# Patient Record
Sex: Male | Born: 1955 | Race: White | Hispanic: No | Smoking: Never smoker
Health system: Southern US, Community
[De-identification: ages and names within clinical notes are randomized; demographics above are authoritative.]

## PROBLEM LIST (undated history)

## (undated) HISTORY — PX: VASECTOMY: SHX75

---

## 2012-02-15 ENCOUNTER — Other Ambulatory Visit: Payer: Self-pay | Admitting: Emergency Medicine

## 2012-02-15 ENCOUNTER — Ambulatory Visit (INDEPENDENT_AMBULATORY_CARE_PROVIDER_SITE_OTHER): Payer: BC Managed Care – PPO | Admitting: Emergency Medicine

## 2012-02-15 VITALS — BP 118/74 | HR 66 | Temp 97.9°F | Resp 16 | Ht 69.0 in | Wt 170.0 lb

## 2012-02-15 DIAGNOSIS — Z23 Encounter for immunization: Secondary | ICD-10-CM

## 2012-02-15 DIAGNOSIS — K625 Hemorrhage of anus and rectum: Secondary | ICD-10-CM

## 2012-02-15 DIAGNOSIS — Z0000 Encounter for general adult medical examination without abnormal findings: Secondary | ICD-10-CM

## 2012-02-15 LAB — POCT CBC
Granulocyte percent: 59.9 %G (ref 37–80)
MCH, POC: 28.8 pg (ref 27–31.2)
MCHC: 32.3 g/dL (ref 31.8–35.4)
MPV: 9.1 fL (ref 0–99.8)
Platelet Count, POC: 300 10*3/uL (ref 142–424)
RBC: 5.17 M/uL (ref 4.69–6.13)
RDW, POC: 13.7 %

## 2012-02-15 LAB — IFOBT (OCCULT BLOOD): IFOBT: NEGATIVE

## 2012-02-15 LAB — COMPREHENSIVE METABOLIC PANEL
AST: 20 U/L (ref 0–37)
Alkaline Phosphatase: 80 U/L (ref 39–117)
BUN: 12 mg/dL (ref 6–23)
Glucose, Bld: 99 mg/dL (ref 70–99)
Sodium: 139 mEq/L (ref 135–145)
Total Bilirubin: 1.7 mg/dL — ABNORMAL HIGH (ref 0.3–1.2)

## 2012-02-15 LAB — LIPID PANEL
Cholesterol: 179 mg/dL (ref 0–200)
LDL Cholesterol: 101 mg/dL — ABNORMAL HIGH (ref 0–99)
Total CHOL/HDL Ratio: 4.1 Ratio
Triglycerides: 168 mg/dL — ABNORMAL HIGH (ref <150)
VLDL: 34 mg/dL (ref 0–40)

## 2012-02-15 LAB — POCT URINALYSIS DIPSTICK
Glucose, UA: NEGATIVE
Nitrite, UA: NEGATIVE
Protein, UA: NEGATIVE
Spec Grav, UA: <=1.005
Urobilinogen, UA: 0.2

## 2012-02-15 LAB — POCT UA - MICROSCOPIC ONLY
Bacteria, U Microscopic: NEGATIVE
Casts, Ur, LPF, POC: NEGATIVE
Crystals, Ur, HPF, POC: NEGATIVE
Yeast, UA: NEGATIVE

## 2012-02-15 NOTE — Patient Instructions (Signed)
We'll go ahead and try Cardizem gel and lidocaine gel to see if that will help with the rectal bleeding .

## 2012-02-15 NOTE — Progress Notes (Signed)
@[facility] @  Patient ID: Jeffery Clements MRN: [id number], DOB: [birth year] 56 y.o. Date of Encounter: 02/15/2012, 9:29 AM  Primary Physician: Shade Flood, MD  Chief Complaint: Physical (CPE)  HPI: 56 y.o. y/o male with history noted below here for CPE.  Doing well. No issues/complaints.  Review of Systems:  Consitutional: No fever, chills, fatigue, night sweats, lymphadenopathy, or weight changes. Eyes: No visual changes, eye redness, or discharge. ENT/Mouth: Ears: No otalgia, tinnitus, hearing loss, discharge. Nose: No congestion, rhinorrhea, sinus pain, or epistaxis. Throat: No sore throat, post nasal drip, or teeth pain. Cardiovascular: No CP, palpitations, diaphoresis, DOE, edema, orthopnea, PND. Respiratory: No cough, hemoptysis, SOB, or wheezing. Gastrointestinal: No anorexia, dysphagia, reflux, pain, nausea, vomiting, hematemesis, diarrhea, constipation, he has noted some bright red blood per rectum that he feels is secondary to an anal tear from a hard bowel movement. , or melena. Genitourinary: No dysuria, frequency, urgency, hematuria, incontinence, nocturia, decreased urinary stream, discharge, impotence, or testicular pain/masses. Musculoskeletal: No decreased ROM, myalgias, stiffness, joint swelling, or weakness. Skin: No rash, erythema, lesion changes, pain, warmth, jaundice, or pruritis. Neurological: No headache, dizziness, syncope, seizures, tremors, memory loss, coordination problems, or paresthesias. Psychological: No anxiety, depression, hallucinations, SI/HI. Endocrine: No fatigue, polydipsia, polyphagia, polyuria, or known diabetes. All other systems were reviewed and are otherwise negative.  No past medical history on file. status post vasectomy   No past surgical history on file.  Home Meds:  Prior to Admission medications   Medication Sig Start Date End Date Taking? Authorizing Provider  cetaphil (CETAPHIL) cream Apply 1 g topically as needed.   Yes  Historical Provider, MD  Multiple Vitamin (MULTIVITAMIN) tablet Take 1 tablet by mouth daily.   Yes Historical Provider, MD    Allergies: No Known Allergies  History   Social History  . Marital Status: Divorced    Spouse Name: N/A    Number of Children: N/A  . Years of Education: N/A   Occupational History  . Not on file.   Social History Main Topics  . Smoking status: Never Smoker   . Smokeless tobacco: Not on file  . Alcohol Use: Not on file  . Drug Use: Not on file  . Sexually Active: Not on file   Other Topics Concern  . Not on file   Social History Narrative  . No narrative on file    No family history on file.  Physical Exam:  Blood pressure 118/74, pulse 66, temperature 97.9 F (36.6 C), temperature source Oral, resp. rate 16, height 5\' 9"  (1.753 m), weight 170 lb (77.111 kg), SpO2 97.00%.  General: Well developed, well nourished, in no acute distress. HEENT: Normocephalic, atraumatic. Conjunctiva pink, sclera non-icteric. Pupils 2 mm constricting to 1 mm, round, regular, and equally reactive to light and accomodation. EOMI. Internal auditory canal clear. TMs with good cone of light and without pathology. Nasal mucosa pink. Nares are without discharge. No sinus tenderness. Oral mucosa pink. Dentition is normal His. Pharynx without exudate.   Neck: Supple. Trachea midline. No thyromegaly. Full ROM. No lymphadenopathy. Lungs: Clear to auscultation bilaterally without wheezes, rales, or rhonchi. Breathing is of normal effort and unlabored. Cardiovascular: RRR with S1 S2. No murmurs, rubs, or gallops appreciated. Distal pulses 2+ symmetrically. No carotid or abdominal bruits.  Abdomen: Soft, non-tender, non-distended with normoactive bowel sounds. No hepatosplenomegaly or masses. No rebound/guarding. No CVA tenderness. Without hernias.  Rectal: . Rectal vault without masses. Rectal exam reveals a small tear present at 12:00. This area is not actively  bleeding  the  Genitourinary:  circumcised male. No penile lesions. Testes descended bilaterally, and smooth without tenderness or masses.  Musculoskeletal: Full range of motion and 5/5 strength throughout. Without swelling, atrophy, tenderness, crepitus, or warmth. Extremities without clubbing, cyanosis, or edema. Calves supple. Skin: Warm and moist without erythema, ecchymosis, wounds, or rash. Neuro: A+Ox3. CN II-XII grossly intact. Moves all extremities spontaneously. Full sensation throughout. Normal gait. DTR 2+ throughout upper and lower extremities. Finger to nose intact. Psych:  Responds to questions appropriately with a normal affect.   Studies:   CMET, Lipid, PSA, TSH,   all pending. Patient is  UA:    Assessment/Plan:  56 y.o. y/o  male here for CPE  -  Signed, Earl Lites, MD 02/15/2012 9:29 AM

## 2012-02-16 LAB — T4, FREE: Free T4: 1.09 ng/dL (ref 0.80–1.80)

## 2012-09-11 ENCOUNTER — Ambulatory Visit (INDEPENDENT_AMBULATORY_CARE_PROVIDER_SITE_OTHER): Payer: BC Managed Care – PPO | Admitting: Internal Medicine

## 2012-09-11 ENCOUNTER — Ambulatory Visit: Payer: BC Managed Care – PPO | Admitting: Emergency Medicine

## 2012-09-11 ENCOUNTER — Ambulatory Visit: Payer: BC Managed Care – PPO

## 2012-09-11 VITALS — BP 120/70 | HR 76 | Temp 98.0°F | Resp 16 | Ht 69.0 in | Wt 176.4 lb

## 2012-09-11 DIAGNOSIS — E781 Pure hyperglyceridemia: Secondary | ICD-10-CM

## 2012-09-11 DIAGNOSIS — E039 Hypothyroidism, unspecified: Secondary | ICD-10-CM

## 2012-09-11 DIAGNOSIS — M25539 Pain in unspecified wrist: Secondary | ICD-10-CM

## 2012-09-11 DIAGNOSIS — K602 Anal fissure, unspecified: Secondary | ICD-10-CM | POA: Insufficient documentation

## 2012-09-11 DIAGNOSIS — K635 Polyp of colon: Secondary | ICD-10-CM | POA: Insufficient documentation

## 2012-09-11 LAB — T4, FREE: Free T4: 0.97 ng/dL (ref 0.80–1.80)

## 2012-09-11 NOTE — Progress Notes (Signed)
  Subjective:    Patient ID: Jeffery Clements, male    DOB: April 13, 1956, 57 y.o.   MRN: [id number]  HPI patient enters for recheck of his elevated TSH. He overall is doing well he has had his colonoscopy which revealed polyps. He's had no further bleeding from his rectal fissure. He is in today primarily to have his triglycerides repeated as well as his TSH and T4. His bilirubin was also elevated on his last visit with normal LFTs. He is recently had some difficulty with his right wrist. He has pain when he supinates and pronates the right wrist. He's had no numbness in his hand.    Review of Systems     Objective:   Physical Exam HEENT exam is normal. Neck is supple. Chest is clear. Cardiac is regular rate without murmurs. Abdomen soft nontender to He has a negative Tinel's negative Finkelstein and negative Phalen's test. There is no atrophy of the thenar eminence. There is mild discomfort on forced into full supination pronation.  [facility] reading (PRIMARY) by  Dr.Daub normal .      Assessment & Plan:  I suspect a discomfort in his wrist is related to overuse. I have advised him to take some nonsteroidals as needed. We'll go ahead and repeat his lipids thyroid and fractionate his bilirubin

## 2012-09-12 LAB — BILIRUBIN, TOTAL: Total Bilirubin: 1.5 mg/dL — ABNORMAL HIGH (ref 0.3–1.2)

## 2012-09-12 LAB — BILIRUBIN,DIRECT & INDIRECT (FRACTIONATED): Indirect Bilirubin: 1.3 mg/dL — ABNORMAL HIGH (ref 0.0–0.9)

## 2012-09-12 LAB — LIPID PANEL
Cholesterol: 219 mg/dL — ABNORMAL HIGH (ref 0–200)
LDL Cholesterol: 134 mg/dL — ABNORMAL HIGH (ref 0–99)
VLDL: 37 mg/dL (ref 0–40)

## 2012-09-14 NOTE — Progress Notes (Signed)
  Subjective:    Patient ID: Jeffery Clements, male    DOB: 02-10-1956, 57 y.o.   MRN: [id number]  HPI    Review of Systems     Objective:   Physical Exam        Assessment & Plan:  Erroneous encounter

## 2013-02-12 ENCOUNTER — Telehealth: Payer: Self-pay

## 2013-02-12 ENCOUNTER — Ambulatory Visit (INDEPENDENT_AMBULATORY_CARE_PROVIDER_SITE_OTHER): Payer: BC Managed Care – PPO | Admitting: Emergency Medicine

## 2013-02-12 ENCOUNTER — Encounter: Payer: Self-pay | Admitting: Emergency Medicine

## 2013-02-12 VITALS — BP 104/74 | HR 65 | Temp 98.1°F | Resp 16 | Ht 68.5 in | Wt 171.6 lb

## 2013-02-12 DIAGNOSIS — R319 Hematuria, unspecified: Secondary | ICD-10-CM

## 2013-02-12 DIAGNOSIS — E039 Hypothyroidism, unspecified: Secondary | ICD-10-CM

## 2013-02-12 DIAGNOSIS — L259 Unspecified contact dermatitis, unspecified cause: Secondary | ICD-10-CM

## 2013-02-12 DIAGNOSIS — L309 Dermatitis, unspecified: Secondary | ICD-10-CM

## 2013-02-12 DIAGNOSIS — Z0000 Encounter for general adult medical examination without abnormal findings: Secondary | ICD-10-CM

## 2013-02-12 DIAGNOSIS — L989 Disorder of the skin and subcutaneous tissue, unspecified: Secondary | ICD-10-CM

## 2013-02-12 LAB — COMPREHENSIVE METABOLIC PANEL
AST: 20 U/L (ref 0–37)
Alkaline Phosphatase: 83 U/L (ref 39–117)
BUN: 13 mg/dL (ref 6–23)
Creat: 0.94 mg/dL (ref 0.50–1.35)
Total Bilirubin: 1.6 mg/dL — ABNORMAL HIGH (ref 0.3–1.2)

## 2013-02-12 LAB — POCT UA - MICROSCOPIC ONLY: Yeast, UA: NEGATIVE

## 2013-02-12 LAB — CBC WITH DIFFERENTIAL/PLATELET
Basophils Absolute: 0.1 10*3/uL (ref 0.0–0.1)
Basophils Relative: 1 % (ref 0–1)
Eosinophils Relative: 3 % (ref 0–5)
HCT: 44.5 % (ref 39.0–52.0)
MCHC: 34.4 g/dL (ref 30.0–36.0)
MCV: 84.9 fL (ref 78.0–100.0)
Monocytes Absolute: 0.6 10*3/uL (ref 0.1–1.0)
Neutro Abs: 3.6 10*3/uL (ref 1.7–7.7)
Platelets: 284 10*3/uL (ref 150–400)
RDW: 13.5 % (ref 11.5–15.5)

## 2013-02-12 LAB — LIPID PANEL
HDL: 46 mg/dL (ref >39)
LDL Cholesterol: 104 mg/dL — ABNORMAL HIGH (ref 0–99)
Total CHOL/HDL Ratio: 4.1 Ratio
VLDL: 40 mg/dL (ref 0–40)

## 2013-02-12 LAB — POCT URINALYSIS DIPSTICK
Ketones, UA: NEGATIVE
Leukocytes, UA: NEGATIVE
Protein, UA: NEGATIVE
Urobilinogen, UA: 0.2
pH, UA: 5.5

## 2013-02-12 LAB — PSA: PSA: 0.76 ng/mL (ref <=4.00)

## 2013-02-12 LAB — TSH: TSH: 6.098 u[IU]/mL — ABNORMAL HIGH (ref 0.350–4.500)

## 2013-02-12 MED ORDER — TRIAMCINOLONE 0.1 % CREAM:EUCERIN CREAM 1:1: 1.0000 "application " | TOPICAL_CREAM | Freq: Three times a day (TID) | CUTANEOUS | Status: DC

## 2013-02-12 NOTE — Telephone Encounter (Signed)
Go ahead and cancel the appointment for the CT scan. See if we can get him in for an appointment with a large urology this week to evaluate him for hematuria.

## 2013-02-12 NOTE — Telephone Encounter (Signed)
Wants to talk to Dr. Cleta Alberts about the referrals he recommended during his exam this morning. This would be very expensive as it will go to his deductible and would like to know if there are any alternatives before going this route, or if Dr. Cleta Alberts thinks it is necessary.  [phone]

## 2013-02-12 NOTE — Progress Notes (Signed)
@[facility] @  Patient ID: Jeffery Clements MRN: [id number], DOB: 12/08/1955 57 y.o. Date of Encounter: 02/12/2013, 8:09 AM  Primary Physician: Shade Flood, MD  Chief Complaint: Physical (CPE)  HPI: 57 y.o. y/o male with history noted below here for CPE.  Doing well. No issues/complaints.  Review of Systems: Consitutional: No fever, chills, fatigue, night sweats, lymphadenopathy, or weight changes. He has a history of borderline elevation of his TSH Eyes: No visual changes, eye redness, or discharge. ENT/Mouth: Ears: No otalgia, tinnitus, hearing loss, discharge. Nose: No congestion, rhinorrhea, sinus pain, or epistaxis. Throat: No sore throat, post nasal drip, or teeth pain. Cardiovascular: No CP, palpitations, diaphoresis, DOE, edema, orthopnea, PND. Respiratory: No cough, hemoptysis, SOB, or wheezing. Gastrointestinal: No anorexia, dysphagia, reflux, pain, nausea, vomiting, hematemesis, diarrhea, constipation, BRBPR, or melena. He has used her rectal fissure but this is not currently bothering him Genitourinary: No dysuria, frequency, urgency, hematuria, incontinence, nocturia, decreased urinary stream, discharge, impotence, or testicular pain/masses. Musculoskeletal: No decreased ROM, myalgias, stiffness, joint swelling, or weakness. Skin: No rash, erythema, lesion changes, pain, warmth, jaundice, or pruritis. He has some dry skin issues he would like a refill on his cream for that problem Neurological: No headache, dizziness, syncope, seizures, tremors, memory loss, coordination problems, or paresthesias. Psychological: No anxiety, depression, hallucinations, SI/HI. Endocrine: No fatigue, polydipsia, polyphagia, polyuria, or known diabetes. All other systems were reviewed and are otherwise negative.  No past medical history on file.   No past surgical history on file.  Home Meds:  Prior to Admission medications   Medication Sig Start Date End Date Taking? Authorizing Provider   cetaphil (CETAPHIL) cream Apply 1 g topically as needed.   Yes Historical Provider, MD  Multiple Vitamin (MULTIVITAMIN) tablet Take 1 tablet by mouth daily.   Yes Historical Provider, MD    Allergies: No Known Allergies  History   Social History  . Marital Status: Divorced    Spouse Name: N/A    Number of Children: N/A  . Years of Education: N/A   Occupational History  . Not on file.   Social History Main Topics  . Smoking status: Never Smoker   . Smokeless tobacco: Not on file  . Alcohol Use: No  . Drug Use: No  . Sexually Active: Not on file   Other Topics Concern  . Not on file   Social History Narrative  . No narrative on file    Family History  Problem Relation Age of Onset  . Lung disease Mother     Physical Exam:  Blood pressure 104/74, pulse 65, temperature 98.1 F (36.7 C), temperature source Oral, resp. rate 16, height 5' 8.5" (1.74 m), weight 171 lb 9.6 oz (77.837 kg), SpO2 96.00%.  General: Well developed, well nourished, in no acute distress. HEENT: Normocephalic, atraumatic. Conjunctiva pink, sclera non-icteric. Pupils 2 mm constricting to 1 mm, round, regular, and equally reactive to light and accomodation. EOMI. Internal auditory canal clear. TMs with good cone of light and without pathology. Nasal mucosa pink. Nares are without discharge. No sinus tenderness. Oral mucosa pink. Dentition . Pharynx without exudate.   Neck: Supple. Trachea midline. No thyromegaly. Full ROM. No lymphadenopathy. Lungs: Clear to auscultation bilaterally without wheezes, rales, or rhonchi. Breathing is of normal effort and unlabored. Cardiovascular: RRR with S1 S2. No murmurs, rubs, or gallops appreciated. Distal pulses 2+ symmetrically. No carotid or abdominal bruits.  Abdomen: Soft, non-tender, non-distended with normoactive bowel sounds. No hepatosplenomegaly or masses. No rebound/guarding. No CVA tenderness. Without hernias.  Rectal: No external hemorrhoids or fissures.  Rectal vault without masses.   Genitourinary:   circumcised male. No penile lesions. Testes descended bilaterally, and smooth without tenderness or masses.  Musculoskeletal: Full range of motion and 5/5 strength throughout. Without swelling, atrophy, tenderness, crepitus, or warmth. Extremities without clubbing, cyanosis, or edema. Calves supple. Skin: Warm and moist without erythema, ecchymosis, wounds, or rash. Neuro: A+Ox3. CN II-XII grossly intact. Moves all extremities spontaneously. Full sensation throughout. Normal gait. DTR 2+ throughout upper and lower extremities. Finger to nose intact. Psych:  Responds to questions appropriately with a normal affect.   Results for orders placed in visit on 09/11/12  T4, FREE      Result Value Range   Free T4 0.97  0.80 - 1.80 ng/dL  TSH      Result Value Range   TSH 5.214 (*) 0.350 - 4.500 uIU/mL  LIPID PANEL      Result Value Range   Cholesterol 219 (*) 0 - 200 mg/dL   Triglycerides 147 (*) <150 mg/dL   HDL 48  >82 mg/dL   Total CHOL/HDL Ratio 4.6     VLDL 37  0 - 40 mg/dL   LDL Cholesterol 956 (*) 0 - 99 mg/dL  BILIRUBIN,DIRECT & INDIRECT (FRACTIONATED)      Result Value Range   Bilirubin, Direct 0.2  0.0 - 0.3 mg/dL   Indirect Bilirubin 1.3 (*) 0.0 - 0.9 mg/dL  BILIRUBIN, TOTAL      Result Value Range   Total Bilirubin 1.5 (*) 0.3 - 1.2 mg/dL   Studies: CBC, CMET, Lipid, PSA, TSH, also check urine culture.      Assessment/Plan:  57 y.o. y/o male here for a complete physical. He has a history of borderline high TSH no other medical problems he does not smoke or drink and exercises regularly. His urinalysis was unusual in that it showed hyphae and some blood.  -  Signed, Earl Lites, MD 02/12/2013 8:09 AM

## 2013-02-12 NOTE — Progress Notes (Signed)
  Subjective:    Patient ID: Jeffery Clements, male    DOB: March 17, 1956, 57 y.o.   MRN: [id number]  HPI    Review of Systems     Objective:   Physical Exam        Assessment & Plan:

## 2013-02-13 LAB — URINE CULTURE: Organism ID, Bacteria: NO GROWTH

## 2013-02-13 NOTE — Telephone Encounter (Signed)
Urology referral made called patient to advise. CT cancelled also.

## 2013-02-15 ENCOUNTER — Ambulatory Visit (INDEPENDENT_AMBULATORY_CARE_PROVIDER_SITE_OTHER): Payer: BC Managed Care – PPO | Admitting: Physician Assistant

## 2013-02-15 VITALS — BP 120/80 | HR 70 | Temp 98.5°F | Resp 16 | Ht 68.0 in | Wt 171.0 lb

## 2013-02-15 DIAGNOSIS — R300 Dysuria: Secondary | ICD-10-CM

## 2013-02-15 LAB — POCT URINALYSIS DIPSTICK
Bilirubin, UA: NEGATIVE
Glucose, UA: NEGATIVE
Ketones, UA: NEGATIVE
Leukocytes, UA: NEGATIVE
Nitrite, UA: NEGATIVE
Protein, UA: NEGATIVE
Spec Grav, UA: 1.02
Urobilinogen, UA: 0.2
pH, UA: 5.5

## 2013-02-15 LAB — POCT UA - MICROSCOPIC ONLY
Casts, Ur, LPF, POC: NEGATIVE
Crystals, Ur, HPF, POC: NEGATIVE
Mucus, UA: NEGATIVE
Yeast, UA: NEGATIVE

## 2013-02-15 MED ORDER — FLUCONAZOLE 150 MG PO TABS: 150.0000 mg | ORAL_TABLET | Freq: Once | ORAL | Status: DC

## 2013-02-15 NOTE — Progress Notes (Signed)
   [address] [place] [address]   Phone [phone]  Subjective:    Patient ID: Jeffery Clements, male    DOB: 1956/03/13, 57 y.o.   MRN: [id number]  HPI  Pt presents to clinic with penile irritation.  He was seen on Tues for his CPE and during that visit he was diagnosed with hematuria and a referral to urology was done.  He was not having symptoms at the time but then yesterday he started to notice that he had burning at the tip of his penis all the time.  Last pm he had some discomfort in his L testicular but it was not related to urination.  He is not sexually active.  He is not having penile discharge.  Review of Systems  Genitourinary: Positive for penile pain (irritation at the tip of the urethra) and testicular pain (mild on the L). Negative for dysuria, frequency, flank pain, discharge, penile swelling, scrotal swelling and difficulty urinating.       Objective:   Physical Exam  Vitals reviewed. Constitutional: He appears well-developed and well-nourished.  HENT:  Head: Normocephalic and atraumatic.  Right Ear: External ear normal.  Left Ear: External ear normal.  Eyes: Conjunctivae are normal.  Pulmonary/Chest: Effort normal.  Genitourinary: Testes normal and penis normal. No penile erythema or penile tenderness. No discharge found.  Epididymis on the L is large but not tender.  Skin: Skin is warm and dry.  Psychiatric: He has a normal mood and affect. His behavior is normal. Judgment and thought content normal.       Assessment & Plan:  Dysuria - Due to yeast in his urine on Tuesday and urethral irritation will treat for yeast.  I wonder if this is partially psychological due to worry regarding hematuria because there is not irritation on exam - it is possible that he has a really early epididymitis but because there is not TTP on exam will wait on abx but if the pain worsens and starts to be with urination more he will call for an abx prior to his appt otherwise he  will go to his urology appt on Monday. - Plan: POCT urinalysis dipstick, POCT UA - Microscopic Only, fluconazole (DIFLUCAN) 150 MG tablet  Continue to go to his urology appt on Monday for hematuria.  Benny Lennert PA-C 02/15/2013 10:12 AM

## 2013-02-18 ENCOUNTER — Other Ambulatory Visit: Payer: Self-pay

## 2014-04-07 ENCOUNTER — Ambulatory Visit (INDEPENDENT_AMBULATORY_CARE_PROVIDER_SITE_OTHER): Payer: BC Managed Care – PPO | Admitting: Emergency Medicine

## 2014-04-07 ENCOUNTER — Ambulatory Visit (INDEPENDENT_AMBULATORY_CARE_PROVIDER_SITE_OTHER): Payer: BC Managed Care – PPO

## 2014-04-07 ENCOUNTER — Encounter: Payer: Self-pay | Admitting: Emergency Medicine

## 2014-04-07 VITALS — BP 124/72 | HR 65 | Temp 98.3°F | Resp 16 | Ht 69.0 in | Wt 172.2 lb

## 2014-04-07 DIAGNOSIS — Z801 Family history of malignant neoplasm of trachea, bronchus and lung: Secondary | ICD-10-CM

## 2014-04-07 DIAGNOSIS — R319 Hematuria, unspecified: Secondary | ICD-10-CM

## 2014-04-07 DIAGNOSIS — Z1211 Encounter for screening for malignant neoplasm of colon: Secondary | ICD-10-CM

## 2014-04-07 DIAGNOSIS — L259 Unspecified contact dermatitis, unspecified cause: Secondary | ICD-10-CM

## 2014-04-07 DIAGNOSIS — R946 Abnormal results of thyroid function studies: Secondary | ICD-10-CM

## 2014-04-07 DIAGNOSIS — R7989 Other specified abnormal findings of blood chemistry: Secondary | ICD-10-CM

## 2014-04-07 DIAGNOSIS — Z0000 Encounter for general adult medical examination without abnormal findings: Secondary | ICD-10-CM

## 2014-04-07 DIAGNOSIS — L309 Dermatitis, unspecified: Secondary | ICD-10-CM

## 2014-04-07 LAB — POCT URINALYSIS DIPSTICK
Bilirubin, UA: NEGATIVE
GLUCOSE UA: NEGATIVE
Ketones, UA: NEGATIVE
Leukocytes, UA: NEGATIVE
Nitrite, UA: NEGATIVE
PROTEIN UA: NEGATIVE
Urobilinogen, UA: 0.2
pH, UA: 5.5

## 2014-04-07 LAB — CBC WITH DIFFERENTIAL/PLATELET
Basophils Absolute: 0.1 10*3/uL (ref 0.0–0.1)
Basophils Relative: 1 % (ref 0–1)
Eosinophils Absolute: 0.2 10*3/uL (ref 0.0–0.7)
Eosinophils Relative: 3 % (ref 0–5)
HCT: 44.6 % (ref 39.0–52.0)
HEMOGLOBIN: 15.7 g/dL (ref 13.0–17.0)
LYMPHS PCT: 28 % (ref 12–46)
Lymphs Abs: 1.7 10*3/uL (ref 0.7–4.0)
MCH: 30 pg (ref 26.0–34.0)
MCHC: 35.2 g/dL (ref 30.0–36.0)
MCV: 85.3 fL (ref 78.0–100.0)
MONO ABS: 0.5 10*3/uL (ref 0.1–1.0)
MONOS PCT: 8 % (ref 3–12)
NEUTROS ABS: 3.7 10*3/uL (ref 1.7–7.7)
Neutrophils Relative %: 60 % (ref 43–77)
Platelets: 288 10*3/uL (ref 150–400)
RBC: 5.23 MIL/uL (ref 4.22–5.81)
RDW: 13.5 % (ref 11.5–15.5)
WBC: 6.2 10*3/uL (ref 4.0–10.5)

## 2014-04-07 LAB — TSH: TSH: 6.493 u[IU]/mL — AB (ref 0.350–4.500)

## 2014-04-07 LAB — COMPLETE METABOLIC PANEL WITH GFR
ALBUMIN: 4.8 g/dL (ref 3.5–5.2)
ALT: 32 U/L (ref 0–53)
AST: 22 U/L (ref 0–37)
Alkaline Phosphatase: 76 U/L (ref 39–117)
BUN: 16 mg/dL (ref 6–23)
CALCIUM: 9.7 mg/dL (ref 8.4–10.5)
CHLORIDE: 103 meq/L (ref 96–112)
CO2: 27 mEq/L (ref 19–32)
Creat: 1 mg/dL (ref 0.50–1.35)
GFR, EST NON AFRICAN AMERICAN: 82 mL/min
GFR, Est African American: >89 mL/min
GLUCOSE: 99 mg/dL (ref 70–99)
POTASSIUM: 4.6 meq/L (ref 3.5–5.3)
Sodium: 139 mEq/L (ref 135–145)
TOTAL PROTEIN: 7.7 g/dL (ref 6.0–8.3)
Total Bilirubin: 1.5 mg/dL — ABNORMAL HIGH (ref 0.2–1.2)

## 2014-04-07 LAB — IFOBT (OCCULT BLOOD): IMMUNOLOGICAL FECAL OCCULT BLOOD TEST: NEGATIVE

## 2014-04-07 LAB — LIPID PANEL
CHOLESTEROL: 192 mg/dL (ref 0–200)
HDL: 47 mg/dL (ref >39)
LDL Cholesterol: 114 mg/dL — ABNORMAL HIGH (ref 0–99)
Total CHOL/HDL Ratio: 4.1 Ratio
Triglycerides: 157 mg/dL — ABNORMAL HIGH (ref <150)
VLDL: 31 mg/dL (ref 0–40)

## 2014-04-07 LAB — T4, FREE: Free T4: 0.93 ng/dL (ref 0.80–1.80)

## 2014-04-07 MED ORDER — ZOSTER VACCINE LIVE 19400 UNT/0.65ML ~~LOC~~ SOLR: 0.6500 mL | Freq: Once | SUBCUTANEOUS | Status: DC

## 2014-04-07 MED ORDER — CLOBETASOL PROPIONATE 0.05 % EX CREA: 1.0000 "application " | TOPICAL_CREAM | Freq: Two times a day (BID) | CUTANEOUS | Status: DC

## 2014-04-07 NOTE — Progress Notes (Signed)
@[facility] @  Patient ID: Jeffery Clements MRN: [id number], DOB: October 27, 1955 58 y.o. Date of Encounter: 04/07/2014, 10:07 AM  Primary Physician: Wendie Agreste, MD  Chief Complaint: Physical (CPE)  HPI: 58 y.o. y/o male with history noted below here for CPE.  Doing well. No issues/complaints.  Review of Systems Consitutional: No fever, chills, fatigue, night sweats, lymphadenopathy, or weight changes. Eyes: No visual changes, eye redness, or discharge. ENT/Mouth: Ears: No otalgia, tinnitus, hearing loss, discharge. Nose: No congestion, rhinorrhea, sinus pain, or epistaxis. Throat: No sore throat, post nasal drip, or teeth pain. Cardiovascular: No CP, palpitations, diaphoresis, DOE, edema, orthopnea, PND. Respiratory: No cough, hemoptysis, SOB, or wheezing. Gastrointestinal: No anorexia, dysphagia, reflux, pain, nausea, vomiting, hematemesis, diarrhea, constipation, BRBPR, or melena. Genitourinary: No dysuria, frequency, urgency, hematuria, incontinence, nocturia, decreased urinary stream, discharge, impotence, or testicular pain/masses. Musculoskeletal: No decreased ROM, myalgias, stiffness, joint swelling, or weakness. Skin: No rash, erythema, lesion changes, pain, warmth, jaundice, or pruritis. Neurological: No headache, dizziness, syncope, seizures, tremors, memory loss, coordination problems, or paresthesias. Psychological: No anxiety, depression, hallucinations, SI/HI. Endocrine: No fatigue, polydipsia, polyphagia, polyuria, or known diabetes. All other systems were reviewed and are otherwise negative.  No past medical history on file.   Past Surgical History  Procedure Laterality Date  . Vasectomy      Home Meds:  Prior to Admission medications   Medication Sig Start Date End Date Taking? Authorizing Provider  clobetasol cream (TEMOVATE) 0.27 % Apply 1 application topically 2 (two) times daily.   Yes Historical Provider, MD  Multiple Vitamin (MULTIVITAMIN) tablet Take 1  tablet by mouth daily.   Yes Historical Provider, MD  Multiple Vitamins-Minerals (CENTRUM SILVER ADULT 50+) TABS Take by mouth.   Yes Historical Provider, MD  Triamcinolone Acetonide (TRIAMCINOLONE 0.1 % CREAM : EUCERIN) CREA Apply 1 application topically 3 (three) times daily. 02/12/13  Yes Darlyne Russian, MD  cetaphil (CETAPHIL) cream Apply 1 g topically as needed.    Historical Provider, MD  fluconazole (DIFLUCAN) 150 MG tablet Take 1 tablet (150 mg total) by mouth once. 02/15/13   Mancel Bale, PA-C    Allergies: No Known Allergies  History   Social History  . Marital Status: Divorced    Spouse Name: N/A    Number of Children: N/A  . Years of Education: N/A   Occupational History  . Accountant    Social History Main Topics  . Smoking status: Never Smoker   . Smokeless tobacco: Not on file  . Alcohol Use: No  . Drug Use: No  . Sexual Activity: Not on file   Other Topics Concern  . Not on file   Social History Narrative   Divorced. Education: [employer]. Exercise: [employer] 3 times a week for 30 minutes.    Family History  Problem Relation Age of Onset  . Lung disease Mother     Physical Exam Blood pressure 124/72, pulse 65, temperature 98.3 F (36.8 C), temperature source Oral, resp. rate 16, height 5\' 9"  (1.753 m), weight 172 lb 3.2 oz (78.109 kg), SpO2 99.00%.  General: Well developed, well nourished, in no acute distress. HEENT: Normocephalic, atraumatic. Conjunctiva pink, sclera non-icteric. Pupils 2 mm constricting to 1 mm, round, regular, and equally reactive to light and accomodation. EOMI. Internal auditory canal clear. TMs with good cone of light and without pathology. Nasal mucosa pink. Nares are without discharge. No sinus tenderness. Oral mucosa pink. Dentitio. Pharynx without exudate.   Neck: Supple. Trachea midline. No thyromegaly. Full ROM. No lymphadenopathy. Lungs:  Clear to auscultation bilaterally without wheezes, rales, or rhonchi. Breathing is of normal  effort and unlabored. Cardiovascular: RRR with S1 S2. No murmurs, rubs, or gallops appreciated. Distal pulses 2+ symmetrically. No carotid or abdominal bruits Abdomen: Soft, non-tender, non-distended with normoactive bowel sounds. No hepatosplenomegaly or masses. No rebound/guarding. No CVA tenderness. Without hernias.  Rectal: No external hemorrhoids or fissures. Rectal vault without masses  Genitourinary:  circumcised male. No penile lesions. Testes descended bilaterally, and smooth without tenderness or masses.  Musculoskeletal: Full range of motion and 5/5 strength throughout. Without swelling, atrophy, tenderness, crepitus, or warmth. Extremities without clubbing, cyanosis, or edema. Calves supple. Skin: Warm and moist without erythema, ecchymosis, wounds, or rash. Neuro: A+Ox3. CN II-XII grossly intact. Moves all extremities spontaneously. Full sensation throughout. Normal gait. DTR 2+ throughout upper and lower extremities. Finger to nose intact. Psych:  Responds to questions appropriately with a normal affect.  [facility] reading (PRIMARY) by  DrDaub no acute disease  Assessment/Plan:  58 y.o. y/o  male here for CPE. His steroids cream was refilled he uses for eczema. His physical exam is completely normal . TSH was slightly elevated last visit will recheck today. I did a chest x-ray today because of a family history of pulmonary disease. -  Signed, Nena Jordan, MD 04/07/2014 10:07 AM

## 2014-04-09 ENCOUNTER — Other Ambulatory Visit: Payer: Self-pay | Admitting: *Deleted

## 2014-04-09 DIAGNOSIS — E039 Hypothyroidism, unspecified: Secondary | ICD-10-CM

## 2014-04-09 MED ORDER — LEVOTHYROXINE SODIUM 75 MCG PO TABS: 75.0000 ug | ORAL_TABLET | Freq: Every day | ORAL | Status: DC

## 2014-07-10 ENCOUNTER — Ambulatory Visit (INDEPENDENT_AMBULATORY_CARE_PROVIDER_SITE_OTHER): Payer: BC Managed Care – PPO

## 2014-07-10 ENCOUNTER — Encounter: Payer: Self-pay | Admitting: Emergency Medicine

## 2014-07-10 ENCOUNTER — Ambulatory Visit (INDEPENDENT_AMBULATORY_CARE_PROVIDER_SITE_OTHER): Payer: BC Managed Care – PPO | Admitting: Emergency Medicine

## 2014-07-10 VITALS — BP 124/80 | HR 63 | Temp 98.3°F | Resp 16 | Ht 68.5 in | Wt 171.8 lb

## 2014-07-10 DIAGNOSIS — L309 Dermatitis, unspecified: Secondary | ICD-10-CM

## 2014-07-10 DIAGNOSIS — M25521 Pain in right elbow: Secondary | ICD-10-CM

## 2014-07-10 DIAGNOSIS — E039 Hypothyroidism, unspecified: Secondary | ICD-10-CM

## 2014-07-10 LAB — TSH: TSH: 0.606 u[IU]/mL (ref 0.350–4.500)

## 2014-07-10 LAB — T4, FREE: Free T4: 1.34 ng/dL (ref 0.80–1.80)

## 2014-07-10 MED ORDER — TRIAMCINOLONE 0.1 % CREAM:EUCERIN CREAM 1:1: 1.0000 "application " | TOPICAL_CREAM | Freq: Three times a day (TID) | CUTANEOUS | Status: DC

## 2014-07-10 NOTE — Patient Instructions (Addendum)
Lateral Epicondylitis (Tennis Elbow) with Rehab Lateral epicondylitis involves inflammation and pain around the outer portion of the elbow. The pain is caused by inflammation of the tendons in the forearm that bring back (extend) the wrist. Lateral epicondylitis is also called tennis elbow, because it is very common in tennis players. However, it may occur in any individual who extends the wrist repetitively. If lateral epicondylitis is left untreated, it may become a chronic problem. SYMPTOMS   Pain, tenderness, and inflammation on the outer (lateral) side of the elbow.  Pain or weakness with gripping activities.  Pain that increases with wrist-twisting motions (playing tennis, using a screwdriver, opening a door or a jar).  Pain with lifting objects, including a coffee cup. CAUSES  Lateral epicondylitis is caused by inflammation of the tendons that extend the wrist. Causes of injury may include:  Repetitive stress and strain on the muscles and tendons that extend the wrist.  Sudden change in activity level or intensity.  Incorrect grip in racquet sports.  Incorrect grip size of racquet (often too large).  Incorrect hitting position or technique (usually backhand, leading with the elbow).  Using a racket that is too heavy. RISK INCREASES WITH:  Sports or occupations that require repetitive and/or strenuous forearm and wrist movements (tennis, squash, racquetball, carpentry).  Poor wrist and forearm strength and flexibility.  Failure to warm up properly before activity.  Resuming activity before healing, rehabilitation, and conditioning are complete. PREVENTION   Warm up and stretch properly before activity.  Maintain physical fitness:  Strength, flexibility, and endurance.  Cardiovascular fitness.  Wear and use properly fitted equipment.  Learn and use proper technique and have a coach correct improper technique.  Wear a tennis elbow (counterforce) brace. PROGNOSIS    The course of this condition depends on the degree of the injury. If treated properly, acute cases (symptoms lasting less than 4 weeks) are often resolved in 2 to 6 weeks. Chronic (longer lasting cases) often resolve in 3 to 6 months but may require physical therapy. RELATED COMPLICATIONS   Frequently recurring symptoms, resulting in a chronic problem. Properly treating the problem the first time decreases frequency of recurrence.  Chronic inflammation, scarring tendon degeneration, and partial tendon tear, requiring surgery.  Delayed healing or resolution of symptoms. TREATMENT  Treatment first involves the use of ice and medicine to reduce pain and inflammation. Strengthening and stretching exercises may help reduce discomfort if performed regularly. These exercises may be performed at home if the condition is an acute injury. Chronic cases may require a referral to a physical therapist for evaluation and treatment. Your caregiver may advise a corticosteroid injection to help reduce inflammation. Rarely, surgery is needed. MEDICATION  If pain medicine is needed, nonsteroidal anti-inflammatory medicines (aspirin and ibuprofen), or other minor pain relievers (acetaminophen), are often advised.  Do not take pain medicine for 7 days before surgery.  Prescription pain relievers may be given, if your caregiver thinks they are needed. Use only as directed and only as much as you need.  Corticosteroid injections may be recommended. These injections should be reserved only for the most severe cases, because they can only be given a certain number of times. HEAT AND COLD  Cold treatment (icing) should be applied for 10 to 15 minutes every 2 to 3 hours for inflammation and pain, and immediately after activity that aggravates your symptoms. Use ice packs or an ice massage.  Heat treatment may be used before performing stretching and strengthening activities prescribed by your   caregiver, physical  therapist, or athletic trainer. Use a heat pack or a warm water soak. SEEK MEDICAL CARE IF: Symptoms get worse or do not improve in 2 weeks, despite treatment. EXERCISES  RANGE OF MOTION (ROM) AND STRETCHING EXERCISES - Epicondylitis, Lateral (Tennis Elbow)

## 2014-07-10 NOTE — Progress Notes (Signed)
   Subjective:  This chart was scribed for Darlyne Russian, MD by Ladene Artist, ED Scribe. The patient was seen in room 23. Patient's care was started at 11:29 AM.   Patient ID: Jeffery Clements, male    DOB: May 04, 1956, 58 y.o.   MRN: [id number]  Chief Complaint  Patient presents with  . Follow-up  . Thyroid  . inflammation right elbow x 2-3 months   HPI HPI Comments: Jeffery Clements is a 58 y.o. male who presents to the [facility] for follow-up regarding thyroid. Pt states that he has not noticed a difference since being placed on Synthroid.   Elbow Pain Pt reports bilateral elbow pain, R worse than L, over the past 3 months. Pain is exacerbated with lifting a glass and using a computer mouse at work. Pt attributes pain to doing over-the-head exercises 3 months ago. He reports associated warmth within the joints that he describes as intermittent.    Urology Pt started seeing urologist, Dr. Janice Norrie at [place], in 2013 for hematuria that has resolved. Pt follows up annually. Next appointment 07/2014.   No past medical history on file. Current Outpatient Prescriptions on File Prior to Visit  Medication Sig Dispense Refill  . clobetasol cream (TEMOVATE) 0.93 % Apply 1 application topically 2 (two) times daily. 60 g 1  . levothyroxine (SYNTHROID) 75 MCG tablet Take 1 tablet (75 mcg total) by mouth daily before breakfast. 30 tablet 5  . Multiple Vitamins-Minerals (CENTRUM SILVER ADULT 50+) TABS Take by mouth.    . Triamcinolone Acetonide (TRIAMCINOLONE 0.1 % CREAM : EUCERIN) CREA Apply 1 application topically 3 (three) times daily. 1 each 0  . zoster vaccine live, PF, (ZOSTAVAX) 81829 UNT/0.65ML injection Inject 19,400 Units into the skin once. 1 each 0  . cetaphil (CETAPHIL) cream Apply 1 g topically as needed.    . fluconazole (DIFLUCAN) 150 MG tablet Take 1 tablet (150 mg total) by mouth once. 1 tablet 0  . Multiple Vitamin (MULTIVITAMIN) tablet Take 1 tablet by mouth  daily.     No current facility-administered medications on file prior to visit.   No Known Allergies  Review of Systems  Constitutional: Negative for fever and chills.  Eyes: Negative for visual disturbance.  Respiratory: Negative for chest tightness and shortness of breath.   Cardiovascular: Negative for chest pain.  Genitourinary: Negative for hematuria.  Musculoskeletal: Positive for arthralgias.  Neurological: Negative for dizziness and light-headedness.      Objective:   Physical Exam CONSTITUTIONAL: Well developed/well nourished HEAD: Normocephalic/atraumatic EYES: EOMI/PERRL ENMT: Mucous membranes moist NECK: supple no meningeal signs SPINE/BACK:entire spine nontender CV: S1/S2 noted, no murmurs/rubs/gallops noted LUNGS: Lungs are clear to auscultation bilaterally, no apparent distress ABDOMEN: soft, nontender, no rebound or guarding, bowel sounds noted throughout abdomen GU:no cva tenderness NEURO: Pt is awake/alert/appropriate, moves all extremitiesx4. No facial droop. Motor strength and reflexes are normal.  EXTREMITIES: pulses normal/equal, full ROM. Tenderness over R lateral epicondyl. Pain with extension of wrist and index finger against resistance. SKIN: warm, color normal PSYCH: no abnormalities of mood noted, alert and oriented to situation   [facility] reading (PRIMARY) by  Dr. Everlene Farrier x-rays of the right elbow are normal.  Assessment & Plan:  Thyroid studies were done. He will get use ice and Aleve for his elbow discomfort. Recheck 6 months. I personally performed the services described in this documentation, which was scribed in my presence. The recorded information has been reviewed and is accurate.

## 2014-09-23 ENCOUNTER — Other Ambulatory Visit: Payer: Self-pay | Admitting: Emergency Medicine

## 2015-01-30 ENCOUNTER — Other Ambulatory Visit: Payer: Self-pay | Admitting: Emergency Medicine

## 2015-02-18 ENCOUNTER — Other Ambulatory Visit: Payer: Self-pay | Admitting: Physician Assistant

## 2015-03-26 ENCOUNTER — Other Ambulatory Visit: Payer: Self-pay | Admitting: Physician Assistant

## 2015-04-01 ENCOUNTER — Ambulatory Visit (INDEPENDENT_AMBULATORY_CARE_PROVIDER_SITE_OTHER): Payer: BLUE CROSS/BLUE SHIELD | Admitting: Family Medicine

## 2015-04-01 VITALS — BP 110/72 | HR 63 | Temp 97.9°F | Resp 18 | Ht 69.25 in | Wt 179.8 lb

## 2015-04-01 DIAGNOSIS — R319 Hematuria, unspecified: Secondary | ICD-10-CM | POA: Diagnosis not present

## 2015-04-01 DIAGNOSIS — R300 Dysuria: Secondary | ICD-10-CM

## 2015-04-01 DIAGNOSIS — N410 Acute prostatitis: Secondary | ICD-10-CM | POA: Diagnosis not present

## 2015-04-01 LAB — POCT URINALYSIS DIPSTICK
Bilirubin, UA: NEGATIVE
Glucose, UA: NEGATIVE
Ketones, UA: NEGATIVE
LEUKOCYTES UA: NEGATIVE
Nitrite, UA: NEGATIVE
PROTEIN UA: NEGATIVE
Spec Grav, UA: 1.02
UROBILINOGEN UA: 0.2
pH, UA: 6

## 2015-04-01 LAB — POCT CBC
GRANULOCYTE PERCENT: 61 % (ref 37–80)
HCT, POC: 46.2 % (ref 43.5–53.7)
Hemoglobin: 15.1 g/dL (ref 14.1–18.1)
Lymph, poc: 2.6 (ref 0.6–3.4)
MCH, POC: 27.9 pg (ref 27–31.2)
MCHC: 32.8 g/dL (ref 31.8–35.4)
MCV: 85.3 fL (ref 80–97)
MID (cbc): 0.6 (ref 0–0.9)
MPV: 7.2 fL (ref 0–99.8)
POC GRANULOCYTE: 5.1 (ref 2–6.9)
POC LYMPH PERCENT: 31.4 %L (ref 10–50)
POC MID %: 7.6 %M (ref 0–12)
Platelet Count, POC: 310 10*3/uL (ref 142–424)
RBC: 5.42 M/uL (ref 4.69–6.13)
RDW, POC: 13 %
WBC: 8.3 10*3/uL (ref 4.6–10.2)

## 2015-04-01 LAB — POCT UA - MICROSCOPIC ONLY
CASTS, UR, LPF, POC: NEGATIVE
CRYSTALS, UR, HPF, POC: NEGATIVE
Mucus, UA: NEGATIVE
WBC, Ur, HPF, POC: NEGATIVE
Yeast, UA: NEGATIVE

## 2015-04-01 MED ORDER — SULFAMETHOXAZOLE-TRIMETHOPRIM 800-160 MG PO TABS: 1.0000 | ORAL_TABLET | Freq: Two times a day (BID) | ORAL | Status: DC

## 2015-04-01 NOTE — Patient Instructions (Signed)
Sulfamethoxazole; Trimethoprim, SMX-TMP tablets What is this medicine? SULFAMETHOXAZOLE; TRIMETHOPRIM or SMX-TMP (suhl fuh meth OK suh zohl; trye METH oh prim) is a combination of a sulfonamide antibiotic and a second antibiotic, trimethoprim. It is used to treat or prevent certain kinds of bacterial infections. It will not work for colds, flu, or other viral infections. This medicine may be used for other purposes; ask your health care provider or pharmacist if you have questions. COMMON BRAND NAME(S): Bacter-Aid DS, Bactrim, Bactrim DS, Septra, Septra DS What should I tell my health care provider before I take this medicine? They need to know if you have any of these conditions: -anemia -asthma -being treated with anticonvulsants -if you frequently drink alcohol containing drinks -kidney disease -liver disease -low level of folic acid or glucose-6-phosphate dehydrogenase -poor nutrition or malabsorption -porphyria -severe allergies -thyroid disorder -an unusual or allergic reaction to sulfamethoxazole, trimethoprim, sulfa drugs, other medicines, foods, dyes, or preservatives -pregnant or trying to get pregnant -breast-feeding How should I use this medicine? Take this medicine by mouth with a full glass of water. Follow the directions on the prescription label. Take your medicine at regular intervals. Do not take it more often than directed. Do not skip doses or stop your medicine early. Talk to your pediatrician regarding the use of this medicine in children. Special care may be needed. This medicine has been used in children as young as 2 months of age. Overdosage: If you think you have taken too much of this medicine contact a poison control center or emergency room at once. NOTE: This medicine is only for you. Do not share this medicine with others. What if I miss a dose? If you miss a dose, take it as soon as you can. If it is almost time for your next dose, take only that dose. Do  not take double or extra doses. What may interact with this medicine? Do not take this medicine with any of the following medications: -aminobenzoate potassium -dofetilide -metronidazole This medicine may also interact with the following medications: -ACE inhibitors like benazepril, enalapril, lisinopril, and ramipril -birth control pills -cyclosporine -digoxin -diuretics -indomethacin -medicines for diabetes -methenamine -methotrexate -phenytoin -potassium supplements -pyrimethamine -sulfinpyrazone -tricyclic antidepressants -warfarin This list may not describe all possible interactions. Give your health care provider a list of all the medicines, herbs, non-prescription drugs, or dietary supplements you use. Also tell them if you smoke, drink alcohol, or use illegal drugs. Some items may interact with your medicine. What should I watch for while using this medicine? Tell your doctor or health care professional if your symptoms do not improve. Drink several glasses of water a day to reduce the risk of kidney problems. Do not treat diarrhea with over the counter products. Contact your doctor if you have diarrhea that lasts more than 2 days or if it is severe and watery. This medicine can make you more sensitive to the sun. Keep out of the sun. If you cannot avoid being in the sun, wear protective clothing and use a sunscreen. Do not use sun lamps or tanning beds/booths. What side effects may I notice from receiving this medicine? Side effects that you should report to your doctor or health care professional as soon as possible: -allergic reactions like skin rash or hives, swelling of the face, lips, or tongue -breathing problems -fever or chills, sore throat -irregular heartbeat, chest pain -joint or muscle pain -pain or difficulty passing urine -red pinpoint spots on skin -redness, blistering, peeling or loosening of   the skin, including inside the mouth -unusual bleeding or  bruising -unusually weak or tired -yellowing of the eyes or skin Side effects that usually do not require medical attention (report to your doctor or health care professional if they continue or are bothersome): -diarrhea -dizziness -headache -loss of appetite -nausea, vomiting -nervousness This list may not describe all possible side effects. Call your doctor for medical advice about side effects. You may report side effects to FDA at 1-800-FDA-1088. Where should I keep my medicine? Keep out of the reach of children. Store at room temperature between 20 to 25 degrees C (68 to 77 degrees F). Protect from light. Throw away any unused medicine after the expiration date. NOTE: This sheet is a summary. It may not cover all possible information. If you have questions about this medicine, talk to your doctor, pharmacist, or health care provider.  2015, Elsevier/Gold Standard. (2013-03-15 14:38:26) Prostatitis The prostate gland is about the size and shape of a walnut. It is located just below your bladder. It produces one of the components of semen, which is made up of sperm and the fluids that help nourish and transport it out from the testicles. Prostatitis is inflammation of the prostate gland.  There are four types of prostatitis:  Acute bacterial prostatitis. This is the least common type of prostatitis. It starts quickly and usually is associated with a bladder infection, high fever, and shaking chills. It can occur at any age.  Chronic bacterial prostatitis. This is a persistent bacterial infection in the prostate. It usually develops from repeated acute bacterial prostatitis or acute bacterial prostatitis that was not properly treated. It can occur in men of any age but is most common in middle-aged men whose prostate has begun to enlarge. The symptoms are not as severe as those in acute bacterial prostatitis. Discomfort in the part of your body that is in front of your rectum and below  your scrotum (perineum), lower abdomen, or in the head of your penis (glans) may represent your primary discomfort.  Chronic prostatitis (nonbacterial). This is the most common type of prostatitis. It is inflammation of the prostate gland that is not caused by a bacterial infection. The cause is unknown and may be associated with a viral infection or autoimmune disorder.  Prostatodynia (pelvic floor disorder). This is associated with increased muscular tone in the pelvis surrounding the prostate. CAUSES The causes of bacterial prostatitis are bacterial infection. The causes of the other types of prostatitis are unknown.  SYMPTOMS  Symptoms can vary depending upon the type of prostatitis that exists. There can also be overlap in symptoms. Possible symptoms for each type of prostatitis are listed below. Acute Bacterial Prostatitis  Painful urination.  Fever or chills.  Muscle or joint pains.  Low back pain.  Low abdominal pain.  Inability to empty bladder completely. Chronic Bacterial Prostatitis, Chronic Nonbacterial Prostatitis, and Prostatodynia  Sudden urge to urinate.  Frequent urination.  Difficulty starting urine stream.  Weak urine stream.  Discharge from the urethra.  Dribbling after urination.  Rectal pain.  Pain in the testicles, penis, or tip of the penis.  Pain in the perineum.  Problems with sexual function.  Painful ejaculation.  Bloody semen. DIAGNOSIS  In order to diagnose prostatitis, your health care provider will ask about your symptoms. One or more urine samples will be taken and tested (urinalysis). If the urinalysis result is negative for bacteria, your health care provider may use a finger to feel your prostate (digital rectal  exam). This exam helps your health care provider determine if your prostate is swollen and tender. It will also produce a specimen of semen that can be analyzed. TREATMENT  Treatment for prostatitis depends on the cause.  If a bacterial infection is the cause, it can be treated with antibiotic medicine. In cases of chronic bacterial prostatitis, the use of antibiotics for up to 1 month or 6 weeks may be necessary. Your health care provider may instruct you to take sitz baths to help relieve pain. A sitz bath is a bath of hot water in which your hips and buttocks are under water. This relaxes the pelvic floor muscles and often helps to relieve the pressure on your prostate. HOME CARE INSTRUCTIONS   Take all medicines as directed by your health care provider.  Take sitz baths as directed by your health care provider. SEEK MEDICAL CARE IF:   Your symptoms get worse, not better.  You have a fever. SEEK IMMEDIATE MEDICAL CARE IF:   You have chills.  You feel nauseous or vomit.  You feel lightheaded or faint.  You are unable to urinate.  You have blood or blood clots in your urine. MAKE SURE YOU:  Understand these instructions.  Will watch your condition.  Will get help right away if you are not doing well or get worse. Document Released: 08/05/2000 Document Revised: 08/13/2013 Document Reviewed: 02/25/2013 [facility] Patient Information 2015 [place], [place]. This information is not intended to replace advice given to you by your health care provider. Make sure you discuss any questions you have with your health care provider.

## 2015-04-01 NOTE — Progress Notes (Signed)
[facility] [address], [place] [address] [phone]  Date:  04/01/2015   Name:  Jeffery Clements   DOB:  11/30/1955   MRN:  [id number]  PCP:  Wendie Agreste, MD    Chief Complaint: Dysuria and Testicle Pain   History of Present Illness:  Jeffery Clements is a 59 y.o. very pleasant male patient who presents with the following:  Dysuria, warmth of both testicles: - Began several weeks ago, then worsened the last few days prompting her to come in.  - No systemic symptoms.  - Denies penile d/c.   - No hematuria or blood in semen.   - Minimal pain when not urinating. No increased frequency or urgency.  - Last seen by urology for hematuria in 07/2014 and cleared for further follow up at that time.  - No previous testicular issues.  - Had yeast  Infection several years ago.  - Not sexually active.  Denies ever having an STI.  - No history of kidney stones - no allergies   Patient Active Problem List   Diagnosis Date Noted  . Hypertriglyceridemia 09/11/2012  . Rectal fissure 09/11/2012  . Colon polyps 09/11/2012    History reviewed. No pertinent past medical history.  Past Surgical History  Procedure Laterality Date  . Vasectomy      Social History  Substance Use Topics  . Smoking status: Never Smoker   . Smokeless tobacco: None  . Alcohol Use: No    Family History  Problem Relation Age of Onset  . Lung disease Mother     No Known Allergies  Medication list has been reviewed and updated.  Current Outpatient Prescriptions on File Prior to Visit  Medication Sig Dispense Refill  . clobetasol cream (TEMOVATE) 3.82 % Apply 1 application topically 2 (two) times daily. 60 g 1  . levothyroxine (SYNTHROID, LEVOTHROID) 75 MCG tablet TAKE ONE TABLET BY MOUTH ONCE DAILY BEFORE  BREAKFAST 30 tablet 0  . Multiple Vitamin (MULTIVITAMIN) tablet Take 1 tablet by mouth daily.    . Multiple Vitamins-Minerals (CENTRUM SILVER ADULT 50+) TABS Take by  mouth.    . Triamcinolone Acetonide (TRIAMCINOLONE 0.1 % CREAM : EUCERIN) CREA Apply 1 application topically 3 (three) times daily. 1 each 3  . cetaphil (CETAPHIL) cream Apply 1 g topically as needed.    . fluconazole (DIFLUCAN) 150 MG tablet Take 1 tablet (150 mg total) by mouth once. (Patient not taking: Reported on 04/01/2015) 1 tablet 0  . zoster vaccine live, PF, (ZOSTAVAX) 50539 UNT/0.65ML injection Inject 19,400 Units into the skin once. (Patient not taking: Reported on 04/01/2015) 1 each 0   No current facility-administered medications on file prior to visit.    Review of Systems:  Review of Systems  Constitutional: Negative for fever, chills, weight loss and malaise/fatigue.  Respiratory: Negative for cough, sputum production, shortness of breath and wheezing.   Cardiovascular: Negative for chest pain.  Gastrointestinal: Negative for nausea, vomiting and constipation.  Genitourinary: Positive for dysuria. Negative for urgency, frequency and hematuria.  Musculoskeletal: Negative for myalgias.  Skin: Negative for rash.  Neurological: Negative for headaches.  Endo/Heme/Allergies: Does not bruise/bleed easily.    Physical Examination: Filed Vitals:   04/01/15 1837  BP: 110/72  Pulse: 63  Temp: 97.9 F (36.6 C)  Resp: 18   Filed Vitals:   04/01/15 1837  Height: 5' 9.25" (1.759 m)  Weight: 179 lb 12.8 oz (81.557 kg)   Body mass index is 26.36 kg/(m^2). Ideal  Body Weight: Weight in (lb) to have BMI = 25: 170.2  GEN: WDWN, NAD, Non-toxic, A & O x 3 HEENT: Atraumatic, Normocephalic. Neck supple. No masses, No LAD. Ears and Nose: No external deformity. CV: RRR, No M/G/R. No JVD. No thrill. No extra heart sounds. PULM: CTA B, no wheezes, crackles, rhonchi. No retractions. No resp. distress. No accessory muscle use. ABD: S, NT, ND, +BS. No rebound. No HSM. GU: no visible erythema or swelling. Non-tender testicles b/l with no lumps.   Rectal: Firm, mildly enlarged prostate  without nodularity.  Moderately TTP, which the patient did feel was consistent with his symptoms.  EXTR: No c/c/e. No inguinal LAD.  NEURO Normal gait.  PSYCH: Normally interactive. Conversant. Not depressed or anxious appearing.  Calm demeanor.   Assessment and Plan: Acute Prostatitis with hematuria.  - Bactrim 800-160mg  bid x 2 weeks. Consider prolonging course at f/u in 2 weeks.  - CBC negative today.  - Will check urine culture and PSA today - f/u immediately with any worsening or new  - f/u in 2 weeks for previously scheduled   Hematuria: following prostatitis treatment ensure that urine clears.  Patient does have a history of hematuria and previously was cleared by urology.   Signed Gerre Pebbles, MD

## 2015-04-03 LAB — URINE CULTURE
Colony Count: NO GROWTH
ORGANISM ID, BACTERIA: NO GROWTH

## 2015-04-03 LAB — PSA: PSA: 2.09 ng/mL (ref <=4.00)

## 2015-04-03 NOTE — Progress Notes (Signed)
The recorded information has been reviewed and considered. Agree with A/P. Dr Marin Comment

## 2015-04-07 ENCOUNTER — Telehealth: Payer: Self-pay | Admitting: Family Medicine

## 2015-04-07 NOTE — Telephone Encounter (Signed)
I called Jeffery Clements regarding his PSA and urine culture results. He did not answer. I left a brief voicemail stating it was not an emergency and I would call back at a later date.

## 2015-04-14 ENCOUNTER — Other Ambulatory Visit: Payer: Self-pay | Admitting: Emergency Medicine

## 2015-04-14 ENCOUNTER — Encounter: Payer: Self-pay | Admitting: Emergency Medicine

## 2015-04-14 ENCOUNTER — Ambulatory Visit (INDEPENDENT_AMBULATORY_CARE_PROVIDER_SITE_OTHER): Payer: BLUE CROSS/BLUE SHIELD | Admitting: Emergency Medicine

## 2015-04-14 ENCOUNTER — Encounter: Payer: Self-pay | Admitting: Family Medicine

## 2015-04-14 ENCOUNTER — Ambulatory Visit (INDEPENDENT_AMBULATORY_CARE_PROVIDER_SITE_OTHER): Payer: BLUE CROSS/BLUE SHIELD

## 2015-04-14 VITALS — BP 112/77 | HR 77 | Temp 98.5°F | Resp 16 | Ht 69.0 in | Wt 174.0 lb

## 2015-04-14 DIAGNOSIS — Z0000 Encounter for general adult medical examination without abnormal findings: Secondary | ICD-10-CM | POA: Diagnosis not present

## 2015-04-14 DIAGNOSIS — R319 Hematuria, unspecified: Secondary | ICD-10-CM | POA: Diagnosis not present

## 2015-04-14 DIAGNOSIS — N410 Acute prostatitis: Secondary | ICD-10-CM | POA: Diagnosis not present

## 2015-04-14 DIAGNOSIS — R946 Abnormal results of thyroid function studies: Secondary | ICD-10-CM | POA: Diagnosis not present

## 2015-04-14 DIAGNOSIS — R7989 Other specified abnormal findings of blood chemistry: Secondary | ICD-10-CM

## 2015-04-14 LAB — LIPID PANEL
CHOLESTEROL: 167 mg/dL (ref 125–200)
HDL: 44 mg/dL (ref >=40)
LDL CALC: 95 mg/dL (ref <130)
Total CHOL/HDL Ratio: 3.8 Ratio (ref <=5.0)
Triglycerides: 141 mg/dL (ref <150)
VLDL: 28 mg/dL (ref <30)

## 2015-04-14 LAB — POCT UA - MICROSCOPIC ONLY
CASTS, UR, LPF, POC: NEGATIVE
CRYSTALS, UR, HPF, POC: NEGATIVE
Epithelial cells, urine per micros: NEGATIVE
Mucus, UA: NEGATIVE
YEAST UA: NEGATIVE

## 2015-04-14 LAB — COMPLETE METABOLIC PANEL WITH GFR
ALT: 19 U/L (ref 9–46)
AST: 19 U/L (ref 10–35)
Albumin: 4.6 g/dL (ref 3.6–5.1)
Alkaline Phosphatase: 89 U/L (ref 40–115)
BILIRUBIN TOTAL: 1.4 mg/dL — AB (ref 0.2–1.2)
BUN: 14 mg/dL (ref 7–25)
CALCIUM: 9.5 mg/dL (ref 8.6–10.3)
CO2: 25 mmol/L (ref 20–31)
CREATININE: 1.12 mg/dL (ref 0.70–1.25)
Chloride: 101 mmol/L (ref 98–110)
GFR, EST AFRICAN AMERICAN: 82 mL/min (ref >=60)
GFR, Est Non African American: 71 mL/min (ref >=60)
Glucose, Bld: 86 mg/dL (ref 65–99)
Potassium: 4.1 mmol/L (ref 3.5–5.3)
Sodium: 136 mmol/L (ref 135–146)
TOTAL PROTEIN: 7.3 g/dL (ref 6.1–8.1)

## 2015-04-14 LAB — CBC WITH DIFFERENTIAL/PLATELET
Basophils Absolute: 0.1 10*3/uL (ref 0.0–0.1)
Basophils Relative: 1 % (ref 0–1)
EOS PCT: 2 % (ref 0–5)
Eosinophils Absolute: 0.1 10*3/uL (ref 0.0–0.7)
HEMATOCRIT: 45.1 % (ref 39.0–52.0)
HEMOGLOBIN: 15.3 g/dL (ref 13.0–17.0)
LYMPHS ABS: 2.4 10*3/uL (ref 0.7–4.0)
LYMPHS PCT: 33 % (ref 12–46)
MCH: 29.4 pg (ref 26.0–34.0)
MCHC: 33.9 g/dL (ref 30.0–36.0)
MCV: 86.7 fL (ref 78.0–100.0)
MPV: 10 fL (ref 8.6–12.4)
Monocytes Absolute: 0.7 10*3/uL (ref 0.1–1.0)
Monocytes Relative: 9 % (ref 3–12)
NEUTROS ABS: 4 10*3/uL (ref 1.7–7.7)
NEUTROS PCT: 55 % (ref 43–77)
Platelets: 309 10*3/uL (ref 150–400)
RBC: 5.2 MIL/uL (ref 4.22–5.81)
RDW: 13.3 % (ref 11.5–15.5)
WBC: 7.3 10*3/uL (ref 4.0–10.5)

## 2015-04-14 LAB — POCT URINALYSIS DIPSTICK
Bilirubin, UA: NEGATIVE
Glucose, UA: NEGATIVE
Ketones, UA: NEGATIVE
Leukocytes, UA: NEGATIVE
NITRITE UA: NEGATIVE
PH UA: 6
Protein, UA: NEGATIVE
SPEC GRAV UA: 1.01
UROBILINOGEN UA: 0.2

## 2015-04-14 LAB — TSH: TSH: 1.522 u[IU]/mL (ref 0.350–4.500)

## 2015-04-14 LAB — HEPATITIS C ANTIBODY: HCV AB: NEGATIVE

## 2015-04-14 LAB — HIV ANTIBODY (ROUTINE TESTING W REFLEX): HIV: NONREACTIVE

## 2015-04-14 NOTE — Progress Notes (Addendum)
This chart was scribed for Jeffery Queen, MD by Leandra Kern, Medical Scribe. This patient was seen in Room 24 and the patient's care was started at 8:26 AM.  Chief Complaint:  Chief Complaint  Patient presents with  . Annual Exam    HPI: Jeffery Clements is a 59 y.o. male who reports to [facility] today for a complete physical exam.  Pt reports that about 2 weeks ago he was presented with symptoms of dysuria, pain in the scrotum area. Pt was seen by Dr. Marin Comment here at the office on 08/10 and was diagnosed with acute prostatitis with hematuria. Pt's PSA and urine culture showed no acute findings. pt was prescribed Bactrim 800-160 mg for two weeks from which he has one more dose left. Pt notes that he did find some relief from the medication. Pt denies symptoms of urgency. He also denies family history of prostate issues, working in a chemically involved environment, or having a previous history of kidney stones.  Pt is not a smoker.    History reviewed. No pertinent past medical history. Past Surgical History  Procedure Laterality Date  . Vasectomy     Social History   Social History  . Marital Status: Divorced    Spouse Name: N/A  . Number of Children: N/A  . Years of Education: N/A   Occupational History  . Accountant    Social History Main Topics  . Smoking status: Never Smoker   . Smokeless tobacco: None  . Alcohol Use: No  . Drug Use: No  . Sexual Activity: Not Asked   Other Topics Concern  . None   Social History Narrative   Divorced. Education: [employer]. Exercise: Bouflex 3 times a week for 30 minutes.   Family History  Problem Relation Age of Onset  . Lung disease Mother    No Known Allergies Prior to Admission medications   Medication Sig Start Date End Date Taking? Authorizing Provider  cetaphil (CETAPHIL) cream Apply 1 g topically as needed.    Historical Provider, MD  clobetasol cream (TEMOVATE) 4.09 % Apply 1 application topically 2 (two) times daily.  04/07/14   Darlyne Russian, MD  fluconazole (DIFLUCAN) 150 MG tablet Take 1 tablet (150 mg total) by mouth once. Patient not taking: Reported on 04/01/2015 02/15/13   Mancel Bale, PA-C  levothyroxine (SYNTHROID, LEVOTHROID) 75 MCG tablet TAKE ONE TABLET BY MOUTH ONCE DAILY BEFORE  BREAKFAST 03/27/15   Harrison Mons, PA-C  Multiple Vitamin (MULTIVITAMIN) tablet Take 1 tablet by mouth daily.    Historical Provider, MD  Multiple Vitamins-Minerals (CENTRUM SILVER ADULT 50+) TABS Take by mouth.    Historical Provider, MD  sulfamethoxazole-trimethoprim (BACTRIM DS,SEPTRA DS) 800-160 MG per tablet Take 1 tablet by mouth 2 (two) times daily. 04/01/15   Gerre Pebbles, MD  Triamcinolone Acetonide (TRIAMCINOLONE 0.1 % CREAM : EUCERIN) CREA Apply 1 application topically 3 (three) times daily. 07/10/14   Darlyne Russian, MD  zoster vaccine live, PF, (ZOSTAVAX) 81191 UNT/0.65ML injection Inject 19,400 Units into the skin once. Patient not taking: Reported on 04/01/2015 04/07/14   Darlyne Russian, MD     ROS: The patient has dysuria, hematuria. Denies urinary urgency.  All other systems have been reviewed and were otherwise negative with the exception of those mentioned in the HPI and as above.    PHYSICAL EXAM: Filed Vitals:   04/14/15 0816  BP: 112/77  Pulse: 77  Temp: 98.5 F (36.9 C)  Resp: 16  Body mass index is 25.68 kg/(m^2).   General: Alert, no acute distress HEENT:  Normocephalic, atraumatic, oropharynx patent. Eye: Juliette Mangle [facility] Cardiovascular:  Regular rate and rhythm, no rubs murmurs or gallops.  No Carotid bruits, radial pulse intact. No pedal edema.  Respiratory: Clear to auscultation bilaterally.  No wheezes, rales, or rhonchi.  No cyanosis, no use of accessory musculature Abdominal: No organomegaly, abdomen is soft and non-tender, positive bowel sounds.  No masses. Musculoskeletal: Gait intact. No edema, tenderness Skin: No rashes. Neurologic: Facial musculature symmetric. Psychiatric:  Patient acts appropriately throughout our interaction. Lymphatic: No cervical or submandibular lymphadenopathy Genitourinary/Anorectal: no prostate nodules, minimal prostate tenderness.    LABS: Results for orders placed or performed in visit on 04/01/15  Urine culture  Result Value Ref Range   Colony Count NO GROWTH    Organism ID, Bacteria NO GROWTH   PSA  Result Value Ref Range   PSA 2.09 <=4.00 ng/mL  POCT urinalysis dipstick  Result Value Ref Range   Color, UA yellow    Clarity, UA clear    Glucose, UA neg    Bilirubin, UA neg    Ketones, UA neg    Spec Grav, UA 1.020    Blood, UA small    pH, UA 6.0    Protein, UA neg    Urobilinogen, UA 0.2    Nitrite, UA neg    Leukocytes, UA Negative Negative  POCT UA - Microscopic Only  Result Value Ref Range   WBC, Ur, HPF, POC neg    RBC, urine, microscopic 3-5    Bacteria, U Microscopic trace    Mucus, UA neg    Epithelial cells, urine per micros 0-2    Crystals, Ur, HPF, POC neg    Casts, Ur, LPF, POC neg    Yeast, UA neg   POCT CBC  Result Value Ref Range   WBC 8.3 4.6 - 10.2 K/uL   Lymph, poc 2.6 0.6 - 3.4   POC LYMPH PERCENT 31.4 10 - 50 %L   MID (cbc) 0.6 0 - 0.9   POC MID % 7.6 0 - 12 %M   POC Granulocyte 5.1 2 - 6.9   Granulocyte percent 61.0 37 - 80 %G   RBC 5.42 4.69 - 6.13 M/uL   Hemoglobin 15.1 14.1 - 18.1 g/dL   HCT, POC 46.2 43.5 - 53.7 %   MCV 85.3 80 - 97 fL   MCH, POC 27.9 27 - 31.2 pg   MCHC 32.8 31.8 - 35.4 g/dL   RDW, POC 13.0 %   Platelet Count, POC 310 142 - 424 K/uL   MPV 7.2 0 - 99.8 fL   Results for orders placed or performed in visit on 04/14/15  POCT UA - Microscopic Only  Result Value Ref Range   WBC, Ur, HPF, POC 1-3    RBC, urine, microscopic 1-2    Bacteria, U Microscopic trace    Mucus, UA neg    Epithelial cells, urine per micros neg    Crystals, Ur, HPF, POC neg    Casts, Ur, LPF, POC neg    Yeast, UA neg   POCT urinalysis dipstick  Result Value Ref Range   Color, UA  yellow    Clarity, UA clear    Glucose, UA neg    Bilirubin, UA neg    Ketones, UA neg    Spec Grav, UA 1.010    Blood, UA trace    pH, UA 6.0    Protein,  UA neg    Urobilinogen, UA 0.2    Nitrite, UA neg    Leukocytes, UA Negative Negative    EKG/XRAY:   Primary read interpreted by Dr. Everlene Farrier at [facility]. No acute disease EKG reading is normal sinus rhythm.   ASSESSMENT/PLAN:  Referral made to urology for second opinion. He does have microscopic hematuria. Otherwise patient looks well. He is up-to-date on his health maintenance. Chest x-ray and EKG were normal. We'll recheck in one year. Urology referral made because of his prostatitis. He will get his flu shot at work.   Gross sideeffects, risk and benefits, and alternatives of medications d/w patient. Patient is aware that all medications have potential sideeffects and we are unable to predict every sideeffect or drug-drug interaction that may occur.  Jeffery Queen MD 04/14/2015 9:23 AM

## 2015-04-14 NOTE — Patient Instructions (Signed)

## 2015-04-15 LAB — PSA: PSA: 1.46 ng/mL (ref <=4.00)

## 2015-04-16 LAB — BILIRUBIN, FRACTIONATED(TOT/DIR/INDIR)
BILIRUBIN TOTAL: 1.2 mg/dL (ref 0.2–1.2)
Bilirubin, Direct: 0.2 mg/dL (ref <=0.2)
Indirect Bilirubin: 1 mg/dL (ref 0.2–1.2)

## 2015-04-27 ENCOUNTER — Other Ambulatory Visit: Payer: Self-pay | Admitting: Physician Assistant

## 2015-05-11 LAB — POC HEMOCCULT BLD/STL (HOME/3-CARD/SCREEN)
Card #2 Fecal Occult Blod, POC: NEGATIVE
Card #3 Fecal Occult Blood, POC: NEGATIVE
FECAL OCCULT BLD: NEGATIVE

## 2015-05-11 NOTE — Addendum Note (Signed)
Addended by: Yvette Rack on: 05/11/2015 01:48 PM   Modules accepted: Orders

## 2015-08-26 ENCOUNTER — Other Ambulatory Visit: Payer: Self-pay | Admitting: Physician Assistant

## 2015-09-27 ENCOUNTER — Other Ambulatory Visit: Payer: Self-pay | Admitting: Emergency Medicine

## 2015-09-28 NOTE — Telephone Encounter (Signed)
Last TSH was 8/16, her next appointment is 8/17.  Can we refill?

## 2015-10-26 ENCOUNTER — Other Ambulatory Visit: Payer: Self-pay | Admitting: Emergency Medicine

## 2016-01-30 ENCOUNTER — Other Ambulatory Visit: Payer: Self-pay | Admitting: Emergency Medicine

## 2016-02-29 ENCOUNTER — Other Ambulatory Visit: Payer: Self-pay | Admitting: Family Medicine

## 2016-03-23 ENCOUNTER — Other Ambulatory Visit: Payer: Self-pay | Admitting: Emergency Medicine

## 2016-04-04 ENCOUNTER — Ambulatory Visit (INDEPENDENT_AMBULATORY_CARE_PROVIDER_SITE_OTHER): Payer: BLUE CROSS/BLUE SHIELD | Admitting: Family Medicine

## 2016-04-04 VITALS — BP 110/70 | HR 74 | Temp 97.9°F | Resp 18 | Ht 69.0 in | Wt 183.2 lb

## 2016-04-04 DIAGNOSIS — Z13220 Encounter for screening for lipoid disorders: Secondary | ICD-10-CM | POA: Diagnosis not present

## 2016-04-04 DIAGNOSIS — Z125 Encounter for screening for malignant neoplasm of prostate: Secondary | ICD-10-CM

## 2016-04-04 DIAGNOSIS — E039 Hypothyroidism, unspecified: Secondary | ICD-10-CM | POA: Diagnosis not present

## 2016-04-04 DIAGNOSIS — Z131 Encounter for screening for diabetes mellitus: Secondary | ICD-10-CM | POA: Diagnosis not present

## 2016-04-04 MED ORDER — LEVOTHYROXINE SODIUM 75 MCG PO TABS: ORAL_TABLET | ORAL | 1 refills | Status: DC

## 2016-04-04 NOTE — Progress Notes (Signed)
Subjective:  By signing my name below, I, Raven Small, attest that this documentation has been prepared under the direction and in the presence of [name], MD.  Electronically Signed: Thea Alken, ED Scribe. 04/04/2016. 5:31 PM.   Patient ID: Jeffery Clements, male    DOB: 1955-11-10, 60 y.o.   MRN: [id number]  HPI Chief Complaint  Patient presents with  . Medication Refill    synthroid 75 mcg    HPI Comments: Jeffery Clements is a 60 y.o. male who presents to the [facility] for a medication refill.   Hypothyroidism Lab Results  Component Value Date   TSH 1.522 04/14/2015   Previous pt of Dr. Everlene Farrier. He is schedule for a physical with me on 9/7 but needed refills of his synthroid today. He ran out of medication 3 days ago. He notes gaining 10lbs this year but has working on losing this. He denies feeling more hot or cold lately or hair falling out.    He notes having blood in urine but is follow by urology and  has an appointment with them later this year. Will have PSA testing with urologist.    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 .   Pt is not fasting.   Patient Active Problem List   Diagnosis Date Noted  . Hematuria 04/14/2015  . Hypertriglyceridemia 09/11/2012  . Rectal fissure 09/11/2012  . Colon polyps 09/11/2012   No past medical history on file. Past Surgical History:  Procedure Laterality Date  . VASECTOMY     No Known Allergies Prior to Admission medications   Medication Sig Start Date End Date Taking? Authorizing Provider  clobetasol cream (TEMOVATE) [id number] % Apply 1 application topically 2 (two) times daily. 04/07/14  Yes Darlyne Russian, MD  levothyroxine (SYNTHROID, LEVOTHROID) 75 MCG tablet TAKE ONE TABLET BY MOUTH ONCE DAILY BEFORE  BREAKFAST 03/02/16  Yes Darlyne Russian, MD  Multiple Vitamins-Minerals (CENTRUM SILVER ADULT 50+) TABS Take by mouth.   Yes Historical Provider, MD  Triamcinolone Acetonide (TRIAMCINOLONE  0.1 % CREAM : EUCERIN) CREA Apply 1 application topically 3 (three) times daily. 07/10/14  Yes Darlyne Russian, MD   Social History   Social History  . Marital status: Divorced    Spouse name: N/A  . Number of children: N/A  . 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 Smoking status: Never Smoker  . Smokeless tobacco: Not on file  . Alcohol use No  . Drug use: No  . Sexual activity: Not on file   Other Topics Concern  . Not on file   Social History Narrative   Divorced. Education: [employer]. Exercise: Bouflex 3 times a week for 30 minutes.   Review of Systems  Constitutional: Negative for chills, diaphoresis and fever.  Endocrine: Negative for cold intolerance and heat intolerance.     Objective:   Physical Exam  Constitutional: He is oriented to person, place, and time. He appears well-developed and well-nourished.  HENT:  Head: Normocephalic and atraumatic.  Eyes: EOM are normal. Pupils are equal, round, and reactive to light.  Neck: No JVD present. Carotid bruit is not present.  Cardiovascular: Normal rate, regular rhythm and normal heart sounds.   No murmur heard. Pulmonary/Chest: Effort normal and breath sounds normal. He has no rales.  Musculoskeletal: He exhibits no edema.  Neurological: He is alert and oriented to person, place, and  time.  Skin: Skin is warm and dry.  Psychiatric: He has a normal mood and affect.  Vitals reviewed.   Vitals:   04/04/16 1645  BP: 110/70  Pulse: 74  Resp: 18  Temp: 97.9 F (36.6 C)  TempSrc: Oral  SpO2: 95%  Weight: 183 lb 3.2 oz (83.1 kg)  Height: 5\' 9"  (1.753 m)   Assessment & Plan:   Jeffery Clements is a 60 y.o. male Hypothyroidism, unspecified hypothyroidism type - Plan: levothyroxine (SYNTHROID, LEVOTHROID) 75 MCG tablet, TSH  -Continue same dose of Synthroid, plan on TSH within the next 1 month. Plan on fasting labs at that same time in  anticipation of his upcoming physical.  Screening for hyperlipidemia - Plan: Lipid panel  Screening for diabetes mellitus - Plan: COMPLETE METABOLIC PANEL WITH GFR  Screening for prostate cancer - Plan: PSA  We discussed pros and cons of prostate cancer screening, and after this discussion, he chose to have screening done. PSA obtained, DRE deferred as followed by urology and plan for that testing at their office.   Meds ordered this encounter  Medications  . levothyroxine (SYNTHROID, LEVOTHROID) 75 MCG tablet    Sig: TAKE ONE TABLET BY MOUTH ONCE DAILY BEFORE  BREAKFAST    Dispense:  30 tablet    Refill:  1   Patient Instructions   Return sometime after September 1st for fasting labs. If needed you can have that done the day of your physical. Continue same dose of Synthroid for now.  See you next month.       IF you received an x-ray today, you will receive an invoice from [facility]. Please contact [facility] at [phone] with questions or concerns regarding your invoice.   IF you received labwork today, you will receive an invoice from [employer]. Please contact [facility] at [phone] with questions or concerns regarding your invoice.   Our billing staff will not be able to assist you with questions regarding bills from these companies.  You will be contacted with the lab results as soon as they are available. The fastest way to get your results is to activate your My Chart account. Instructions are located on the last page of this paperwork. If you have not heard from [place] regarding the results in 2 weeks, please contact this office.    ;    I personally performed the services described in this documentation, which was scribed in my presence. The recorded information has been reviewed and considered, and addended by me as needed.   Signed,   [name], MD [facility].    04/06/16 11:31 PM

## 2016-04-04 NOTE — Patient Instructions (Addendum)
Return sometime after September 1st for fasting labs. If needed you can have that done the day of your physical. Continue same dose of Synthroid for now.  See you next month.       IF you received an x-ray today, you will receive an invoice from [facility]. Please contact [facility] at [phone] with questions or concerns regarding your invoice.   IF you received labwork today, you will receive an invoice from [employer]. Please contact [facility] at [phone] with questions or concerns regarding your invoice.   Our billing staff will not be able to assist you with questions regarding bills from these companies.  You will be contacted with the lab results as soon as they are available. The fastest way to get your results is to activate your My Chart account. Instructions are located on the last page of this paperwork. If you have not heard from [place] regarding the results in 2 weeks, please contact this office.    ;

## 2016-04-14 ENCOUNTER — Encounter: Payer: BLUE CROSS/BLUE SHIELD | Admitting: Emergency Medicine

## 2016-04-26 ENCOUNTER — Ambulatory Visit (INDEPENDENT_AMBULATORY_CARE_PROVIDER_SITE_OTHER): Payer: BLUE CROSS/BLUE SHIELD | Admitting: Family Medicine

## 2016-04-26 DIAGNOSIS — E039 Hypothyroidism, unspecified: Secondary | ICD-10-CM

## 2016-04-26 DIAGNOSIS — Z131 Encounter for screening for diabetes mellitus: Secondary | ICD-10-CM | POA: Diagnosis not present

## 2016-04-26 DIAGNOSIS — Z125 Encounter for screening for malignant neoplasm of prostate: Secondary | ICD-10-CM

## 2016-04-26 DIAGNOSIS — Z13220 Encounter for screening for lipoid disorders: Secondary | ICD-10-CM

## 2016-04-26 LAB — COMPLETE METABOLIC PANEL WITH GFR
ALBUMIN: 4.5 g/dL (ref 3.6–5.1)
ALK PHOS: 83 U/L (ref 40–115)
ALT: 34 U/L (ref 9–46)
AST: 23 U/L (ref 10–35)
BILIRUBIN TOTAL: 1.6 mg/dL — AB (ref 0.2–1.2)
BUN: 19 mg/dL (ref 7–25)
CALCIUM: 9.9 mg/dL (ref 8.6–10.3)
CO2: 25 mmol/L (ref 20–31)
CREATININE: 1.03 mg/dL (ref 0.70–1.25)
Chloride: 101 mmol/L (ref 98–110)
GFR, Est African American: >89 mL/min (ref >=60)
GFR, Est Non African American: 78 mL/min (ref >=60)
Glucose, Bld: 92 mg/dL (ref 65–99)
Potassium: 4.6 mmol/L (ref 3.5–5.3)
Sodium: 139 mmol/L (ref 135–146)
Total Protein: 7.4 g/dL (ref 6.1–8.1)

## 2016-04-26 LAB — LIPID PANEL
Cholesterol: 192 mg/dL (ref 125–200)
HDL: 54 mg/dL (ref >=40)
LDL Cholesterol: 115 mg/dL (ref <130)
Total CHOL/HDL Ratio: 3.6 Ratio (ref <=5.0)
Triglycerides: 116 mg/dL (ref <150)
VLDL: 23 mg/dL (ref <30)

## 2016-04-26 LAB — PSA: PSA: 0.5 ng/mL (ref <=4.0)

## 2016-04-26 LAB — TSH: TSH: 3.62 mIU/L (ref 0.40–4.50)

## 2016-04-27 NOTE — Progress Notes (Signed)
Lab visit only. 

## 2016-04-28 ENCOUNTER — Ambulatory Visit (INDEPENDENT_AMBULATORY_CARE_PROVIDER_SITE_OTHER): Payer: BLUE CROSS/BLUE SHIELD | Admitting: Family Medicine

## 2016-04-28 VITALS — BP 122/70 | HR 66 | Temp 98.2°F | Resp 16 | Ht 69.75 in | Wt 183.6 lb

## 2016-04-28 DIAGNOSIS — R6884 Jaw pain: Secondary | ICD-10-CM

## 2016-04-28 DIAGNOSIS — H9313 Tinnitus, bilateral: Secondary | ICD-10-CM

## 2016-04-28 DIAGNOSIS — R0609 Other forms of dyspnea: Secondary | ICD-10-CM | POA: Diagnosis not present

## 2016-04-28 DIAGNOSIS — G4700 Insomnia, unspecified: Secondary | ICD-10-CM

## 2016-04-28 DIAGNOSIS — E039 Hypothyroidism, unspecified: Secondary | ICD-10-CM | POA: Diagnosis not present

## 2016-04-28 DIAGNOSIS — Z0000 Encounter for general adult medical examination without abnormal findings: Secondary | ICD-10-CM

## 2016-04-28 MED ORDER — LEVOTHYROXINE SODIUM 75 MCG PO TABS: ORAL_TABLET | ORAL | 3 refills | Status: DC

## 2016-04-28 MED ORDER — LEVOTHYROXINE SODIUM 75 MCG PO TABS: ORAL_TABLET | ORAL | 1 refills | Status: DC

## 2016-04-28 NOTE — Patient Instructions (Addendum)
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 See information on this below. Also discuss this at your upcoming dentist visit as they may recommend other treatments or even possible bite guard when you sleep. Ibuprofen or Aleve over-the-counter as needed for now.  See information below on ringing in the ears. I will refer you to ear nose and throat for audiogram and possible further evaluation.  I will refer you to a cardiologist for possible stress testing and evaluation of your shortness of breath with exertion. Although this may be due to deconditioning and need for increased exercise, would want to make sure this is not heart related first. If any worsening of your symptoms, return here or emergency room if needed. We can check a chest x-ray next visit if needed.  If trouble sleeping persists, follow-up with me to discuss that further as multiple causes may be present.  Insomnia Insomnia is a sleep disorder that makes it difficult to fall asleep or to stay asleep. Insomnia can cause tiredness (fatigue), low energy, difficulty concentrating, mood swings, and poor performance at work or school.  There are three different ways to classify insomnia:  Difficulty falling asleep.  Difficulty staying asleep.  Waking up too early in the morning. Any type of insomnia can be long-term (chronic) or short-term (acute). Both are common. Short-term insomnia usually lasts for three months or less. Chronic insomnia occurs at least three times a week for longer than three months. CAUSES  Insomnia may be caused by another condition, situation, or substance, such as:  Anxiety.  Certain medicines.  Gastroesophageal reflux disease (GERD) or other gastrointestinal conditions.  Asthma or other breathing conditions.  Restless legs syndrome, sleep apnea, or other sleep disorders.  Chronic pain.  Menopause. This may include hot flashes.  Stroke.  Abuse of alcohol, tobacco, or illegal  drugs.  Depression.  Caffeine.   Neurological disorders, such as Alzheimer disease.  An overactive thyroid (hyperthyroidism). The cause of insomnia may not be known. RISK FACTORS Risk factors for insomnia include:  Gender. Women are more commonly affected than men.  Age. Insomnia is more common as you get older.  Stress. This may involve your professional or personal life.  Income. Insomnia is more common in people with lower income.  Lack of exercise.   Irregular work schedule or night shifts.  Traveling between different time zones. SIGNS AND SYMPTOMS If you have insomnia, trouble falling asleep or trouble staying asleep is the main symptom. This may lead to other symptoms, such as:  Feeling fatigued.  Feeling nervous about going to sleep.  Not feeling rested in the morning.  Having trouble concentrating.  Feeling irritable, anxious, or depressed. TREATMENT  Treatment for insomnia depends on the cause. If your insomnia is caused by an underlying condition, treatment will focus on addressing the condition. Treatment may also include:   Medicines to help you sleep.  Counseling or therapy.  Lifestyle adjustments. HOME CARE INSTRUCTIONS   Take medicines only as directed by your health care provider.  Keep regular sleeping and waking hours. Avoid naps.  Keep a sleep diary to help you and your health care provider figure out what could be causing your insomnia. Include:   When you sleep.  When you wake up during the night.  How well you sleep.   How rested you feel the next day.  Any side effects of medicines you are taking.  What you eat and drink.   Make your bedroom a comfortable place where  it is easy to fall asleep:  Put up shades or special blackout curtains to block light from outside.  Use a white noise machine to block noise.  Keep the temperature cool.   Exercise regularly as directed by your health care provider. Avoid exercising  right before bedtime.  Use relaxation techniques to manage stress. Ask your health care provider to suggest some techniques that may work well for you. These may include:  Breathing exercises.  Routines to release muscle tension.  Visualizing peaceful scenes.  Cut back on alcohol, caffeinated beverages, and cigarettes, especially close to bedtime. These can disrupt your sleep.  Do not overeat or eat spicy foods right before bedtime. This can lead to digestive discomfort that can make it hard for you to sleep.  Limit screen use before bedtime. This includes:  Watching TV.  Using your smartphone, tablet, and computer.  Stick to a routine. This can help you fall asleep faster. Try to do a quiet activity, brush your teeth, and go to bed at the same time each night.  Get out of bed if you are still awake after 15 minutes of trying to sleep. Keep the lights down, but try reading or doing a quiet activity. When you feel sleepy, go back to bed.  Make sure that you drive carefully. Avoid driving if you feel very sleepy.  Keep all follow-up appointments as directed by your health care provider. This is important. SEEK MEDICAL CARE IF:   You are tired throughout the day or have trouble in your daily routine due to sleepiness.  You continue to have sleep problems or your sleep problems get worse. SEEK IMMEDIATE MEDICAL CARE IF:   You have serious thoughts about hurting yourself or someone else.   This information is not intended to replace advice given to you by your health care provider. Make sure you discuss any questions you have with your health care provider.   Document Released: 08/05/2000 Document Revised: 04/29/2015 Document Reviewed: 05/09/2014 Elsevier Interactive Patient Education 2016 [employer].   Tinnitus Tinnitus refers to hearing a sound when there is no actual source for that sound. This is often described as ringing in the ears. However, people with this condition  may hear a variety of noises. A person may hear the sound in one ear or in both ears.   The sounds of tinnitus can be soft, loud, or somewhere in between. Tinnitus can last for a few seconds or can be constant for days. It may go away without treatment and come back at various times. When tinnitus is constant or happens often, it can lead to other problems, such as trouble sleeping and trouble concentrating. Almost everyone experiences tinnitus at some point. Tinnitus that is long-lasting (chronic) or comes back often is a problem that may require medical attention.    CAUSES  The cause of tinnitus is often not known. In some cases, it can result from other problems or conditions, including:   Exposure to loud noises from machinery, music, or other sources.  Hearing loss.  Ear or sinus infections.  Earwax buildup.  A foreign object in the ear.  Use of certain medicines.  Use of alcohol and caffeine.  High blood pressure.  Heart diseases.  Anemia.  Allergies.  Meniere disease.  Thyroid problems.  Tumors.  An enlarged part of a weakened blood vessel (aneurysm). SYMPTOMS The main symptom of tinnitus is hearing a sound when there is no source for that sound. It may sound like:  Buzzing.  Roaring.  Ringing.  Blowing air, similar to the sound heard when you listen to a seashell.  Hissing.  Whistling.  Sizzling.  Humming.  Running water.  A sustained musical note. DIAGNOSIS  Tinnitus is diagnosed based on your symptoms. Your health care provider will do a physical exam. A comprehensive hearing exam (audiologic exam) will be done if your tinnitus:   Affects only one ear (unilateral).  Causes hearing difficulties.  Lasts 6 months or longer. You may also need to see a health care provider who specializes in hearing disorders (audiologist). You may be asked to complete a questionnaire to determine the severity of your tinnitus. Tests may be done to help  determine the cause and to rule out other conditions. These can include:  Imaging studies of your head and brain, such as:  A CT scan.  An MRI.  An imaging study of your blood vessels (angiogram). TREATMENT  Treating an underlying medical condition can sometimes make tinnitus go away. If your tinnitus continues, other treatments may include:  Medicines, such as certain antidepressants or sleeping aids.  Sound generators to mask the tinnitus. These include:  Tabletop sound machines that play relaxing sounds to help you fall asleep.  Wearable devices that fit in your ear and play sounds or music.  A small device that uses headphones to deliver a signal embedded in music (acoustic neural stimulation). In time, this may change the pathways of your brain and make you less sensitive to tinnitus. This device is used for very severe cases when no other treatment is working.  Therapy and counseling to help you manage the stress of living with tinnitus.  Using hearing aids or cochlear implants, if your tinnitus is related to hearing loss. HOME CARE INSTRUCTIONS  When possible, avoid being in loud places and being exposed to loud sounds.  Wear hearing protection, such as earplugs, when you are exposed to loud noises.  Do not take stimulants, such as nicotine, alcohol, or caffeine.  Practice techniques for reducing stress, such as meditation, yoga, or deep breathing.  Use a white noise machine, a humidifier, or other devices to mask the sound of tinnitus.  Sleep with your head slightly raised. This may reduce the impact of tinnitus.  Try to get plenty of rest each night. SEEK MEDICAL CARE IF:  You have tinnitus in just one ear.  Your tinnitus continues for 3 weeks or longer without stopping.  Home care measures are not helping.  You have tinnitus after a head injury.  You have tinnitus along with any of the following:  Dizziness.  Loss of balance.  Nausea and vomiting.    This information is not intended to replace advice given to you by your health care provider. Make sure you discuss any questions you have with your health care provider.   Document Released: 08/08/2005 Document Revised: 08/29/2014 Document Reviewed: 01/08/2014 Elsevier Interactive Patient Education 2016 [employer] Temporomandibular Joint Syndrome Temporomandibular joint (TMJ) syndrome is a condition that affects the joints between your jaw and your skull. The TMJs are located near your ears and allow your jaw to open and close. These joints and the nearby muscles are involved in all movements of the jaw. People with TMJ syndrome have pain in the area of these joints and muscles. Chewing, biting, or other movements of the jaw can be difficult or painful. TMJ syndrome can be caused by various things. In many cases, the condition is mild and goes away within a few  weeks. For some people, the condition can become a long-term problem. CAUSES Possible causes of TMJ syndrome include:  Grinding your teeth or clenching your jaw. Some people do this when they are under stress.  Arthritis.  Injury to the jaw.  Head or neck injury.  Teeth or dentures that are not aligned well. In some cases, the cause of TMJ syndrome may not be known. SIGNS AND SYMPTOMS The most common symptom is an aching pain on the side of the head in the area of the TMJ. Other symptoms may include:  Pain when moving your jaw, such as when chewing or biting.  Being unable to open your jaw all the way.  Making a clicking sound when you open your mouth.  Headache.  Earache.  Neck or shoulder pain. DIAGNOSIS Diagnosis can usually be made based on your symptoms, your medical history, and a physical exam. Your health care provider may check the range of motion of your jaw. Imaging tests, such as X-rays or an MRI, are sometimes done. You may need to see your dentist to determine if your teeth and jaw are lined up  correctly. TREATMENT TMJ syndrome often goes away on its own. If treatment is needed, the options may include:  Eating soft foods and applying ice or heat.  Medicines to relieve pain or inflammation.  Medicines to relax the muscles.  A splint, bite plate, or mouthpiece to prevent teeth grinding or jaw clenching.  Relaxation techniques or counseling to help reduce stress.  Transcutaneous electrical nerve stimulation (TENS). This helps to relieve pain by applying an electrical current through the skin.  Acupuncture. This is sometimes helpful to relieve pain.  Jaw surgery. This is rarely needed. HOME CARE INSTRUCTIONS  Take medicines only as directed by your health care provider.  Eat a soft diet if you are having trouble chewing.  Apply ice to the painful area.  Put ice in a plastic bag.  Place a towel between your skin and the bag.  Leave the ice on for 20 minutes, 2-3 times a day.  Apply a warm compress to the painful area as directed.  Massage your jaw area and perform any jaw stretching exercises as recommended by your health care provider.  If you were given a mouthpiece or bite plate, wear it as directed.  Avoid foods that require a lot of chewing. Do not chew gum.  Keep all follow-up visits as directed by your health care provider. This is important. SEEK MEDICAL CARE IF:  You are having trouble eating.  You have new or worsening symptoms. SEEK IMMEDIATE MEDICAL CARE IF:  Your jaw locks open or closed.   This information is not intended to replace advice given to you by your health care provider. Make sure you discuss any questions you have with your health care provider.   Document Released: 05/03/2001 Document Revised: 08/29/2014 Document Reviewed: 03/13/2014 Elsevier Interactive Patient Education 2016 [place] you healthy  Get these tests  Blood pressure- Have your blood pressure checked once a year by your healthcare provider.   Normal blood pressure is 120/80  Weight- Have your body mass index (BMI) calculated to screen for obesity.  BMI is a measure of body fat based on height and weight. You can also calculate your own BMI at [email or web address].  Cholesterol- Have your cholesterol checked every year.  Diabetes- Have your blood sugar checked regularly if you have high blood pressure, high cholesterol, have a family history of diabetes  or if you are overweight.  Screening for Colon Cancer- Colonoscopy starting at age 31.  Screening may begin sooner depending on your family history and other health conditions. Follow up colonoscopy as directed by your Gastroenterologist.  Screening for Prostate Cancer- Both blood work (PSA) and a rectal exam help screen for Prostate Cancer.  Screening begins at age 68 with African-American men and at age 35 with Caucasian men.  Screening may begin sooner depending on your family history.  Take these medicines  Aspirin- One aspirin daily can help prevent Heart disease and Stroke.  Flu shot- Every fall.  Tetanus- Every 10 years.  Zostavax- Once after the age of 26 to prevent Shingles.  Pneumonia shot- Once after the age of 39; if you are younger than 58, ask your healthcare provider if you need a Pneumonia shot.  Take these steps  Don't smoke- If you do smoke, talk to your doctor about quitting.  For tips on how to quit, go to www.smokefree.gov or call 1-800-QUIT-NOW.  Be physically active- Exercise 5 days a week for at least 30 minutes.  If you are not already physically active start slow and gradually work up to 30 minutes of moderate physical activity.  Examples of moderate activity include walking briskly, mowing the yard, dancing, swimming, bicycling, etc.  Eat a healthy diet- Eat a variety of healthy food such as fruits, vegetables, low fat milk, low fat cheese, yogurt, lean meant, poultry, fish, beans, tofu, etc. For more information go to  www.thenutritionsource.org  Drink alcohol in moderation- Limit alcohol intake to less than two drinks a day. Never drink and drive.  Dentist- Brush and floss twice daily; visit your dentist twice a year.  Depression- Your emotional health is as important as your physical health. If you're feeling down, or losing interest in things you would normally enjoy please talk to your healthcare provider.  Eye exam- Visit your eye doctor every year.  Safe sex- If you may be exposed to a sexually transmitted infection, use a condom.  Seat belts- Seat belts can save your life; always wear one.  Smoke/Carbon Monoxide detectors- These detectors need to be installed on the appropriate level of your home.  Replace batteries at least once a year.  Skin cancer- When out in the sun, cover up and use sunscreen 15 SPF or higher.  Violence- If anyone is threatening you, please tell your healthcare provider.  Living Will/ Health care power of attorney- Speak with your healthcare provider and family.   IF you received an x-ray today, you will receive an invoice from [facility]. Please contact [facility] at [phone] with questions or concerns regarding your invoice.   IF you received labwork today, you will receive an invoice from [employer]. Please contact [facility] at [phone] with questions or concerns regarding your invoice.   Our billing staff will not be able to assist you with questions regarding bills from these companies.  You will be contacted with the lab results as soon as they are available. The fastest way to get your results is to activate your My Chart account. Instructions are located on the last page of this paperwork. If you have not heard from [place] regarding the results in 2 weeks, please contact this office.

## 2016-04-28 NOTE — Progress Notes (Signed)
By signing my name below, I, Mesha Guinyard, attest that this documentation has been prepared under the direction and in the presence of [name].  Electronically Signed: Verlee Monte, Medical Scribe. 04/28/16. 2:08 PM.  Subjective:    Patient ID: Jeffery Clements, male    DOB: 1956/08/02, 60 y.o.   MRN: [id number]  HPI Chief Complaint  Patient presents with  . Annual Exam    HPI Comments: Jeffery Clements is a 60 y.o. male who presents to the [facility] for his annual physical exam. Most recently saw him approx 3 weeks ago. Prev pt of Dr. Everlene Farrier. On review of prev physical from Dr. Everlene Farrier he was having microscopic hematuria at that visit with prev dx of prostatitis, was referred to urology  Left Jaw: Pt reports clicking noise and pain from the left side of his jaw when he chews on harder foods for the past 2-3 weeks. Pt reports associated symptoms lock jaw,  of poor sleep. Pt mentions it's hard to fall asleep. Pt denies change in vision with his jaw pain.  Tinnitis: Pt reports high pitched ringing in bilateral ears for the past 2 months. Pt reports associated symptoms of slight hearing loss. Pt denies high noise exposure, or ear pain, and sinus congestion.  SOB: Pt reports SOB while he mows the lawn for 20 mins or with exertion for the past 6 months. Pt mentions he sleeps with a couple of pillows behind him to make him comfortable in bed. Pt reports having a CXR Apr 02, 2015. Pt's mom died of hardening of the lungs and didn't expand as they should; restrictive fibrosis in her lungs. Pt is not a smoker, and has never smoked. Pt denies chest pain, chest tightness, PND, and orthopnia while experiencing SOB. Pt denies FHX of heart disease.  Hypothyroidism: Takes levothyroxine 75 mcg QD. TSH was in nl range 2 days ago. Lab Results  Component Value Date   TSH 3.62 04/26/2016   HLD: Pt mentions he cut sodas out of his diet. Lab Results  Component Value Date   ALT 34  04/26/2016   AST 23 04/26/2016   ALKPHOS 83 04/26/2016   BILITOT 1.6 (H) 04/26/2016   Lab Results  Component Value Date   CHOL 192 04/26/2016   HDL 54 04/26/2016   LDLCALC 115 04/26/2016   TRIG 116 04/26/2016   CHOLHDL 3.6 04/26/2016   DM: Nl glucose on fasting labs 2 days ago.  Cancer Screening: Prostate CA: Followed by urology. Lab Results  Component Value Date   PSA 0.5 04/26/2016   PSA 1.46 04/14/2015   PSA 2.09 04/01/2015  Colon CA: Colonoscopy in 2013; single polyp removed, and hemorrhoids noted. Repeat in 5 years, which will be next year.  Immunizations: Pt gets his flu shot at work and will get his there. Pt had his shingles shot last year, and the year before then. Immunization History  Administered Date(s) Administered  . Influenza-Unspecified 05/21/2014  . Tdap 02/15/2012  . Zoster 2014/04/01   Vision: Pt is followed by ophthalmologist every 2 years.  Visual Acuity Screening   Right eye Left eye Both eyes  Without correction:     With correction: 20/20 20/20 20/20    Dentist: Pt has an apt next Tuesday (in 5 days), and has an appt every 6 months.  Exercise: Pt is starting to use his bowflex more, but hasn't used it in the past year. Pt reports SOB while he mows the lawn for 20 mins or  with exertion for the past 6 months.  Depression: Depression screen [facility] 2/9 04/28/2016 04/04/2016 04/14/2015 04/01/2015 04/07/2014  Decreased Interest 0 0 0 0 0  Down, Depressed, Hopeless 0 0 0 0 0  PHQ - 2 Score 0 0 0 0 0   Advance directives: Does not have an advance directive. Declined information.  Hep C/HIV Screening: HIV and Hep C screening was performed 04/14/2015 with negative antibodies found from both test.  Patient Active Problem List   Diagnosis Date Noted  . Hematuria 04/14/2015  . Hypertriglyceridemia 09/11/2012  . Rectal fissure 09/11/2012  . Colon polyps 09/11/2012   No past medical history on file. Past Surgical History:  Procedure Laterality Date  .  VASECTOMY     Not on File Prior to Admission medications   Medication Sig Start Date End Date Taking? Authorizing Provider  clobetasol cream (TEMOVATE) [id number] % Apply 1 application topically 2 (two) times daily. 04/07/14   Darlyne Russian, MD  levothyroxine (SYNTHROID, LEVOTHROID) 75 MCG tablet TAKE ONE TABLET BY MOUTH ONCE DAILY BEFORE  BREAKFAST 04/04/16   Wendie Agreste, MD  Multiple Vitamins-Minerals (CENTRUM SILVER ADULT 50+) TABS Take by mouth.    Historical Provider, MD  Triamcinolone Acetonide (TRIAMCINOLONE 0.1 % CREAM : EUCERIN) CREA Apply 1 application topically 3 (three) times daily. 07/10/14   Darlyne Russian, MD   Social History   Social History  . Marital status: Divorced    Spouse name: N/A  . Number of children: N/A  . 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 Smoking status: Never Smoker  . Smokeless tobacco: Not on file  . Alcohol use No  . Drug use: No  . Sexual activity: Not on file   Other Topics Concern  . Not on file   Social History Narrative   Divorced. Education: [employer]. Exercise: Bouflex 3 times a week for 30 minutes.   Review of Systems  13 point ROS positive for SOB, and tinnitus otherwise negative. Objective:  Physical Exam  Constitutional: He is oriented to person, place, and time. He appears well-developed and well-nourished.  HENT:  Head: Normocephalic and atraumatic.  Right Ear: Tympanic membrane and external ear normal.  Left Ear: Tympanic membrane and external ear normal.  Mouth/Throat: Oropharynx is clear and moist.  No popping or clicking at the left TMJ Did not see any gum erythema or significant decay  Eyes: Conjunctivae and EOM are normal. Pupils are equal, round, and reactive to light.  Neck: Normal range of motion. Neck supple. No JVD present. No thyromegaly present.  Cardiovascular: Normal rate, regular rhythm, normal heart sounds and intact distal pulses.     Pulmonary/Chest: Effort normal and breath sounds normal. No respiratory distress. He has no wheezes.  Abdominal: Soft. He exhibits no distension. There is no tenderness.  Musculoskeletal: Normal range of motion. He exhibits no edema (lower extremity) or tenderness.  Lymphadenopathy:    He has no cervical adenopathy.  Neurological: He is alert and oriented to person, place, and time. He has normal reflexes.  Skin: Skin is warm and dry.  Psychiatric: He has a normal mood and affect. His behavior is normal.  Vitals reviewed.  BP 122/70 (BP Location: Right Arm, Patient Position: Sitting, Cuff Size: Normal)   Pulse 66   Temp 98.2 F (36.8 C) (Oral)   Resp 16   Ht 5' 9.75" (1.772 m)   Wt 183 lb 9.6 oz (  83.3 kg)   SpO2 95%   BMI 26.53 kg/m     [EKG Reading]: Sinus bradycardia. No acute findings  Assessment & Plan:   Theodies Rhein is a 60 y.o. male Annual physical exam  --anticipatory guidance as below in AVS, screening labs above. Health maintenance items as above in HPI discussed/recommended as applicable.   Tinnitus, bilateral - Plan: Ambulatory referral to ENT  - bilateral. May be related to high freq hearing loss. eval with audiology/ENT.   Jaw pain  - appears consistent with TMJ syndrome. Short course of nsaid, discuss with dentist at upcoming visit. If any temporal pain or visual sx's - rtc right away, but current exam not suggestive of temporal arteritis.   Dyspnea on exertion - Plan: EKG 12-Lead, Ambulatory referral to Cardiology, CANCELED: DG Chest 2 View  - EKG without acute findings. FH of pulmonary fibrosis. eval by cardiology, then if no source, CXR or pulmonary eval. RTC/ER precautions.   Hypothyroidism, unspecified hypothyroidism type - Plan: levothyroxine (SYNTHROID, LEVOTHROID) 75 MCG tablet, DISCONTINUED: levothyroxine (SYNTHROID, LEVOTHROID) 75 MCG tablet  - cont same dose synthroid, tolerating without difficulty.   Insomnia  - sleep hygiene discussed.  Handout given. rtc to discuss further if sx's persist.   Meds ordered this encounter  Medications  . DISCONTD: levothyroxine (SYNTHROID, LEVOTHROID) 75 MCG tablet    Sig: TAKE ONE TABLET BY MOUTH ONCE DAILY BEFORE  BREAKFAST    Dispense:  90 tablet    Refill:  1  . levothyroxine (SYNTHROID, LEVOTHROID) 75 MCG tablet    Sig: TAKE ONE TABLET BY MOUTH ONCE DAILY BEFORE  BREAKFAST    Dispense:  90 tablet    Refill:  3   Patient Instructions   Your jaw symptoms appear to be due to TMJ syndrome. See information on this below. Also discuss this at your upcoming dentist visit as they may recommend other treatments or even possible bite guard when you sleep. Ibuprofen or Aleve over-the-counter as needed for now.  See information below on ringing in the ears. I will refer you to ear nose and throat for audiogram and possible further evaluation.  I will refer you to a cardiologist for possible stress testing and evaluation of your shortness of breath with exertion. Although this may be due to deconditioning and need for increased exercise, would want to make sure this is not heart related first. If any worsening of your symptoms, return here or emergency room if needed. We can check a chest x-ray next visit if needed.  If trouble sleeping persists, follow-up with me to discuss that further as multiple causes may be present.  Insomnia Insomnia is a sleep disorder that makes it difficult to fall asleep or to stay asleep. Insomnia can cause tiredness (fatigue), low energy, difficulty concentrating, mood swings, and poor performance at work or school.  There are three different ways to classify insomnia:  Difficulty falling asleep.  Difficulty staying asleep.  Waking up too early in the morning. Any type of insomnia can be long-term (chronic) or short-term (acute). Both are common. Short-term insomnia usually lasts for three months or less. Chronic insomnia occurs at least three times a week for  longer than three months. CAUSES  Insomnia may be caused by another condition, situation, or substance, such as:  Anxiety.  Certain medicines.  Gastroesophageal reflux disease (GERD) or other gastrointestinal conditions.  Asthma or other breathing conditions.  Restless legs syndrome, sleep apnea, or other sleep disorders.  Chronic pain.  Menopause. This may  include hot flashes.  Stroke.  Abuse of alcohol, tobacco, or illegal drugs.  Depression.  Caffeine.   Neurological disorders, such as Alzheimer disease.  An overactive thyroid (hyperthyroidism). The cause of insomnia may not be known. RISK FACTORS Risk factors for insomnia include:  Gender. Women are more commonly affected than men.  Age. Insomnia is more common as you get older.  Stress. This may involve your professional or personal life.  Income. Insomnia is more common in people with lower income.  Lack of exercise.   Irregular work schedule or night shifts.  Traveling between different time zones. SIGNS AND SYMPTOMS If you have insomnia, trouble falling asleep or trouble staying asleep is the main symptom. This may lead to other symptoms, such as:  Feeling fatigued.  Feeling nervous about going to sleep.  Not feeling rested in the morning.  Having trouble concentrating.  Feeling irritable, anxious, or depressed. TREATMENT  Treatment for insomnia depends on the cause. If your insomnia is caused by an underlying condition, treatment will focus on addressing the condition. Treatment may also include:   Medicines to help you sleep.  Counseling or therapy.  Lifestyle adjustments. HOME CARE INSTRUCTIONS   Take medicines only as directed by your health care provider.  Keep regular sleeping and waking hours. Avoid naps.  Keep a sleep diary to help you and your health care provider figure out what could be causing your insomnia. Include:   When you sleep.  When you wake up during the  night.  How well you sleep.   How rested you feel the next day.  Any side effects of medicines you are taking.  What you eat and drink.   Make your bedroom a comfortable place where it is easy to fall asleep:  Put up shades or special blackout curtains to block light from outside.  Use a white noise machine to block noise.  Keep the temperature cool.   Exercise regularly as directed by your health care provider. Avoid exercising right before bedtime.  Use relaxation techniques to manage stress. Ask your health care provider to suggest some techniques that may work well for you. These may include:  Breathing exercises.  Routines to release muscle tension.  Visualizing peaceful scenes.  Cut back on alcohol, caffeinated beverages, and cigarettes, especially close to bedtime. These can disrupt your sleep.  Do not overeat or eat spicy foods right before bedtime. This can lead to digestive discomfort that can make it hard for you to sleep.  Limit screen use before bedtime. This includes:  Watching TV.  Using your smartphone, tablet, and computer.  Stick to a routine. This can help you fall asleep faster. Try to do a quiet activity, brush your teeth, and go to bed at the same time each night.  Get out of bed if you are still awake after 15 minutes of trying to sleep. Keep the lights down, but try reading or doing a quiet activity. When you feel sleepy, go back to bed.  Make sure that you drive carefully. Avoid driving if you feel very sleepy.  Keep all follow-up appointments as directed by your health care provider. This is important. SEEK MEDICAL CARE IF:   You are tired throughout the day or have trouble in your daily routine due to sleepiness.  You continue to have sleep problems or your sleep problems get worse. SEEK IMMEDIATE MEDICAL CARE IF:   You have serious thoughts about hurting yourself or someone else.   This information is  not intended to replace advice  given to you by your health care provider. Make sure you discuss any questions you have with your health care provider.   Document Released: 08/05/2000 Document Revised: 04/29/2015 Document Reviewed: 05/09/2014 Elsevier Interactive Patient Education 2016 [employer].   Tinnitus Tinnitus refers to hearing a sound when there is no actual source for that sound. This is often described as ringing in the ears. However, people with this condition may hear a variety of noises. A person may hear the sound in one ear or in both ears.   The sounds of tinnitus can be soft, loud, or somewhere in between. Tinnitus can last for a few seconds or can be constant for days. It may go away without treatment and come back at various times. When tinnitus is constant or happens often, it can lead to other problems, such as trouble sleeping and trouble concentrating. Almost everyone experiences tinnitus at some point. Tinnitus that is long-lasting (chronic) or comes back often is a problem that may require medical attention.    CAUSES  The cause of tinnitus is often not known. In some cases, it can result from other problems or conditions, including:   Exposure to loud noises from machinery, music, or other sources.  Hearing loss.  Ear or sinus infections.  Earwax buildup.  A foreign object in the ear.  Use of certain medicines.  Use of alcohol and caffeine.  High blood pressure.  Heart diseases.  Anemia.  Allergies.  Meniere disease.  Thyroid problems.  Tumors.  An enlarged part of a weakened blood vessel (aneurysm). SYMPTOMS The main symptom of tinnitus is hearing a sound when there is no source for that sound. It may sound like:   Buzzing.  Roaring.  Ringing.  Blowing air, similar to the sound heard when you listen to a seashell.  Hissing.  Whistling.  Sizzling.  Humming.  Running water.  A sustained musical note. DIAGNOSIS  Tinnitus is diagnosed based on your  symptoms. Your health care provider will do a physical exam. A comprehensive hearing exam (audiologic exam) will be done if your tinnitus:   Affects only one ear (unilateral).  Causes hearing difficulties.  Lasts 6 months or longer. You may also need to see a health care provider who specializes in hearing disorders (audiologist). You may be asked to complete a questionnaire to determine the severity of your tinnitus. Tests may be done to help determine the cause and to rule out other conditions. These can include:  Imaging studies of your head and brain, such as:  A CT scan.  An MRI.  An imaging study of your blood vessels (angiogram). TREATMENT  Treating an underlying medical condition can sometimes make tinnitus go away. If your tinnitus continues, other treatments may include:  Medicines, such as certain antidepressants or sleeping aids.  Sound generators to mask the tinnitus. These include:  Tabletop sound machines that play relaxing sounds to help you fall asleep.  Wearable devices that fit in your ear and play sounds or music.  A small device that uses headphones to deliver a signal embedded in music (acoustic neural stimulation). In time, this may change the pathways of your brain and make you less sensitive to tinnitus. This device is used for very severe cases when no other treatment is working.  Therapy and counseling to help you manage the stress of living with tinnitus.  Using hearing aids or cochlear implants, if your tinnitus is related to hearing loss. HOME CARE  INSTRUCTIONS  When possible, avoid being in loud places and being exposed to loud sounds.  Wear hearing protection, such as earplugs, when you are exposed to loud noises.  Do not take stimulants, such as nicotine, alcohol, or caffeine.  Practice techniques for reducing stress, such as meditation, yoga, or deep breathing.  Use a white noise machine, a humidifier, or other devices to mask the sound of  tinnitus.  Sleep with your head slightly raised. This may reduce the impact of tinnitus.  Try to get plenty of rest each night. SEEK MEDICAL CARE IF:  You have tinnitus in just one ear.  Your tinnitus continues for 3 weeks or longer without stopping.  Home care measures are not helping.  You have tinnitus after a head injury.  You have tinnitus along with any of the following:  Dizziness.  Loss of balance.  Nausea and vomiting.   This information is not intended to replace advice given to you by your health care provider. Make sure you discuss any questions you have with your health care provider.   Document Released: 08/08/2005 Document Revised: 08/29/2014 Document Reviewed: 01/08/2014 Elsevier Interactive Patient Education 2016 Elsevier Inc. Temporomandibular Joint Syndrome Temporomandibular joint (TMJ) syndrome is a condition that affects the joints between your jaw and your skull. The TMJs are located near your ears and allow your jaw to open and close. These joints and the nearby muscles are involved in all movements of the jaw. People with TMJ syndrome have pain in the area of these joints and muscles. Chewing, biting, or other movements of the jaw can be difficult or painful. TMJ syndrome can be caused by various things. In many cases, the condition is mild and goes away within a few weeks. For some people, the condition can become a long-term problem. CAUSES Possible causes of TMJ syndrome include:  Grinding your teeth or clenching your jaw. Some people do this when they are under stress.  Arthritis.  Injury to the jaw.  Head or neck injury.  Teeth or dentures that are not aligned well. In some cases, the cause of TMJ syndrome may not be known. SIGNS AND SYMPTOMS The most common symptom is an aching pain on the side of the head in the area of the TMJ. Other symptoms may include:  Pain when moving your jaw, such as when chewing or biting.  Being unable to open  your jaw all the way.  Making a clicking sound when you open your mouth.  Headache.  Earache.  Neck or shoulder pain. DIAGNOSIS Diagnosis can usually be made based on your symptoms, your medical history, and a physical exam. Your health care provider may check the range of motion of your jaw. Imaging tests, such as X-rays or an MRI, are sometimes done. You may need to see your dentist to determine if your teeth and jaw are lined up correctly. TREATMENT TMJ syndrome often goes away on its own. If treatment is needed, the options may include:  Eating soft foods and applying ice or heat.  Medicines to relieve pain or inflammation.  Medicines to relax the muscles.  A splint, bite plate, or mouthpiece to prevent teeth grinding or jaw clenching.  Relaxation techniques or counseling to help reduce stress.  Transcutaneous electrical nerve stimulation (TENS). This helps to relieve pain by applying an electrical current through the skin.  Acupuncture. This is sometimes helpful to relieve pain.  Jaw surgery. This is rarely needed. HOME CARE INSTRUCTIONS  Take medicines only as directed  by your health care provider.  Eat a soft diet if you are having trouble chewing.  Apply ice to the painful area.  Put ice in a plastic bag.  Place a towel between your skin and the bag.  Leave the ice on for 20 minutes, 2-3 times a day.  Apply a warm compress to the painful area as directed.  Massage your jaw area and perform any jaw stretching exercises as recommended by your health care provider.  If you were given a mouthpiece or bite plate, wear it as directed.  Avoid foods that require a lot of chewing. Do not chew gum.  Keep all follow-up visits as directed by your health care provider. This is important. SEEK MEDICAL CARE IF:  You are having trouble eating.  You have new or worsening symptoms. SEEK IMMEDIATE MEDICAL CARE IF:  Your jaw locks open or closed.   This information is  not intended to replace advice given to you by your health care provider. Make sure you discuss any questions you have with your health care provider.   Document Released: 05/03/2001 Document Revised: 08/29/2014 Document Reviewed: 03/13/2014 Elsevier Interactive Patient Education 2016 [place] you healthy  Get these tests  Blood pressure- Have your blood pressure checked once a year by your healthcare provider.  Normal blood pressure is 120/80  Weight- Have your body mass index (BMI) calculated to screen for obesity.  BMI is a measure of body fat based on height and weight. You can also calculate your own BMI at [employer].  Cholesterol- Have your cholesterol checked every year.  Diabetes- Have your blood sugar checked regularly if you have high blood pressure, high cholesterol, have a family history of diabetes or if you are overweight.  Screening for Colon Cancer- Colonoscopy starting at age 47.  Screening may begin sooner depending on your family history and other health conditions. Follow up colonoscopy as directed by your Gastroenterologist.  Screening for Prostate Cancer- Both blood work (PSA) and a rectal exam help screen for Prostate Cancer.  Screening begins at age 41 with African-American men and at age 90 with Caucasian men.  Screening may begin sooner depending on your family history.  Take these medicines  Aspirin- One aspirin daily can help prevent Heart disease and Stroke.  Flu shot- Every fall.  Tetanus- Every 10 years.  Zostavax- Once after the age of 50 to prevent Shingles.  Pneumonia shot- Once after the age of 63; if you are younger than 41, ask your healthcare provider if you need a Pneumonia shot.  Take these steps  Don't smoke- If you do smoke, talk to your doctor about quitting.  For tips on how to quit, go to www.smokefree.gov or call 1-800-QUIT-NOW.  Be physically active- Exercise 5 days a week for at least 30 minutes.   If you are not already physically active start slow and gradually work up to 30 minutes of moderate physical activity.  Examples of moderate activity include walking briskly, mowing the yard, dancing, swimming, bicycling, etc.  Eat a healthy diet- Eat a variety of healthy food such as fruits, vegetables, low fat milk, low fat cheese, yogurt, lean meant, poultry, fish, beans, tofu, etc. For more information go to www.thenutritionsource.org  Drink alcohol in moderation- Limit alcohol intake to less than two drinks a day. Never drink and drive.  Dentist- Brush and floss twice daily; visit your dentist twice a year.  Depression- Your emotional health is as important as your physical  health. If you're feeling down, or losing interest in things you would normally enjoy please talk to your healthcare provider.  Eye exam- Visit your eye doctor every year.  Safe sex- If you may be exposed to a sexually transmitted infection, use a condom.  Seat belts- Seat belts can save your life; always wear one.  Smoke/Carbon Monoxide detectors- These detectors need to be installed on the appropriate level of your home.  Replace batteries at least once a year.  Skin cancer- When out in the sun, cover up and use sunscreen 15 SPF or higher.  Violence- If anyone is threatening you, please tell your healthcare provider.  Living Will/ Health care power of attorney- Speak with your healthcare provider and family.   IF you received an x-ray today, you will receive an invoice from [facility]. Please contact [facility] at [phone] with questions or concerns regarding your invoice.   IF you received labwork today, you will receive an invoice from [employer]. Please contact [facility] at [phone] with questions or concerns regarding your invoice.   Our billing staff will not be able to assist you with questions regarding bills from these companies.  You will be  contacted with the lab results as soon as they are available. The fastest way to get your results is to activate your My Chart account. Instructions are located on the last page of this paperwork. If you have not heard from [place] regarding the results in 2 weeks, please contact this office.        I personally performed the services described in this documentation, which was scribed in my presence. The recorded information has been reviewed and considered, and addended by me as needed.   Signed,   [name], MD [facility].  04/30/16 10:22 PM

## 2016-04-30 ENCOUNTER — Encounter: Payer: Self-pay | Admitting: Family Medicine

## 2016-05-17 DIAGNOSIS — H9313 Tinnitus, bilateral: Secondary | ICD-10-CM | POA: Insufficient documentation

## 2016-05-17 DIAGNOSIS — H905 Unspecified sensorineural hearing loss: Secondary | ICD-10-CM | POA: Insufficient documentation

## 2016-05-20 ENCOUNTER — Encounter: Payer: Self-pay | Admitting: Cardiovascular Disease

## 2016-05-20 ENCOUNTER — Ambulatory Visit (INDEPENDENT_AMBULATORY_CARE_PROVIDER_SITE_OTHER): Payer: BLUE CROSS/BLUE SHIELD | Admitting: Cardiovascular Disease

## 2016-05-20 VITALS — BP 125/85 | HR 72 | Ht 68.0 in | Wt 185.2 lb

## 2016-05-20 DIAGNOSIS — R0609 Other forms of dyspnea: Secondary | ICD-10-CM | POA: Diagnosis not present

## 2016-05-20 DIAGNOSIS — R0600 Dyspnea, unspecified: Secondary | ICD-10-CM

## 2016-05-20 NOTE — Assessment & Plan Note (Signed)
Mr. Boender is a 60 year old gentleman referred for progressive dyspnea on exertion. He basically has no cardiac risk factors. He does not smoke. His mother did die of pulmonary causes. He denies chest pain. He has never had a heart attack or stroke. I'm worried that this may be ischemically mediated. I'm going to get a 2-D echocardiogram and an exercise Myoview stress test. If these are normal he'll need further pulmonary evaluation.

## 2016-05-20 NOTE — Patient Instructions (Signed)
Medication Instructions:  NO CHANGES.   Testing/Procedures: Your physician has requested that you have an echocardiogram. Echocardiography is a painless test that uses sound waves to create images of your heart. It provides your doctor with information about the size and shape of your heart and how well your heart's chambers and valves are working. This procedure takes approximately one hour. There are no restrictions for this procedure.  Your physician has requested that you have en exercise stress myoview.  Follow-Up: Your physician recommends that you schedule a follow-up appointment in: AS NEEDED IF TESTS ARE NORMAL.   Any Other Special Instructions Will Be Listed Below (If Applicable). Echocardiogram An echocardiogram, or echocardiography, uses sound waves (ultrasound) to produce an image of your heart. The echocardiogram is simple, painless, obtained within a short period of time, and offers valuable information to your health care provider. The images from an echocardiogram can provide information such as:  Evidence of coronary artery disease (CAD).  Heart size.  Heart muscle function.  Heart valve function.  Aneurysm detection.  Evidence of a past heart attack.  Fluid buildup around the heart.  Heart muscle thickening.  Assess heart valve function. LET [facility] CARE PROVIDER KNOW ABOUT:  Any allergies you have.  All medicines you are taking, including vitamins, herbs, eye drops, creams, and over-the-counter medicines.  Previous problems you or members of your family have had with the use of anesthetics.  Any blood disorders you have.  Previous surgeries you have had.  Medical conditions you have.  Possibility of pregnancy, if this applies. BEFORE THE PROCEDURE  No special preparation is needed. Eat and drink normally.  PROCEDURE   In order to produce an image of your heart, gel will be applied to your chest and a wand-like tool (transducer) will be  moved over your chest. The gel will help transmit the sound waves from the transducer. The sound waves will harmlessly bounce off your heart to allow the heart images to be captured in real-time motion. These images will then be recorded.  You may need an IV to receive a medicine that improves the quality of the pictures. AFTER THE PROCEDURE You may return to your normal schedule including diet, activities, and medicines, unless your health care provider tells you otherwise.   This information is not intended to replace advice given to you by your health care provider. Make sure you discuss any questions you have with your health care provider.   Document Released: 08/05/2000 Document Revised: 08/29/2014 Document Reviewed: 04/15/2013 Elsevier Interactive Patient Education 2016 [employer].    Exercise Stress Electrocardiogram An exercise stress electrocardiogram is a test that is done to evaluate the blood supply to your heart. This test may also be called exercise stress electrocardiography. The test is done while you are walking on a treadmill. The goal of this test is to raise your heart rate. This test is done to find areas of poor blood flow to the heart by determining the extent of coronary artery disease (CAD).   CAD is defined as narrowing in one or more heart (coronary) arteries of more than 70%. If you have an abnormal test result, this may mean that you are not getting adequate blood flow to your heart during exercise. Additional testing may be needed to understand why your test was abnormal. LET [facility] CARE PROVIDER KNOW ABOUT:   Any allergies you have.  All medicines you are taking, including vitamins, herbs, eye drops, creams, and over-the-counter medicines.  Previous  problems you or members of your family have had with the use of anesthetics.  Any blood disorders you have.  Previous surgeries you have had.  Medical conditions you have.  Possibility of pregnancy, if  this applies. RISKS AND COMPLICATIONS Generally, this is a safe procedure. However, as with any procedure, complications can occur. Possible complications can include:  Pain or pressure in the following areas:  Chest.  Jaw or neck.  Between your shoulder blades.  Radiating down your left arm.  Dizziness or light-headedness.  Shortness of breath.  Increased or irregular heartbeats.  Nausea or vomiting.  Heart attack (rare). BEFORE THE PROCEDURE  Avoid all forms of caffeine 24 hours before your test or as directed by your health care provider. This includes coffee, tea (even decaffeinated tea), caffeinated sodas, chocolate, cocoa, and certain pain medicines.  Follow your health care provider's instructions regarding eating and drinking before the test.  Take your medicines as directed at regular times with water unless instructed otherwise. Exceptions may include:  If you have diabetes, ask how you are to take your insulin or pills. It is common to adjust insulin dosing the morning of the test.  If you are taking beta-blocker medicines, it is important to talk to your health care provider about these medicines well before the date of your test. Taking beta-blocker medicines may interfere with the test. In some cases, these medicines need to be changed or stopped 24 hours or more before the test.  If you wear a nitroglycerin patch, it may need to be removed prior to the test. Ask your health care provider if the patch should be removed before the test.  If you use an inhaler for any breathing condition, bring it with you to the test.  If you are an outpatient, bring a snack so you can eat right after the stress phase of the test.  Do not smoke for 4 hours prior to the test or as directed by your health care provider.  Do not apply lotions, powders, creams, or oils on your chest prior to the test.  Wear loose-fitting clothes and comfortable shoes for the test. This test  involves walking on a treadmill. PROCEDURE  Multiple patches (electrodes) will be put on your chest. If needed, small areas of your chest may have to be shaved to get better contact with the electrodes. Once the electrodes are attached to your body, multiple wires will be attached to the electrodes and your heart rate will be monitored.  Your heart will be monitored both at rest and while exercising.  You will walk on a treadmill. The treadmill will be started at a slow pace. The treadmill speed and incline will gradually be increased to raise your heart rate. AFTER THE PROCEDURE  Your heart rate and blood pressure will be monitored after the test.  You may return to your normal schedule including diet, activities, and medicines, unless your health care provider tells you otherwise.   This information is not intended to replace advice given to you by your health care provider. Make sure you discuss any questions you have with your health care provider.   Document Released: 08/05/2000 Document Revised: 08/13/2013 Document Reviewed: 04/15/2013 Elsevier Interactive Patient Education [employer].   If you need a refill on your cardiac medications before your next appointment, please call your pharmacy.

## 2016-05-20 NOTE — Progress Notes (Signed)
     05/20/2016 [name]   [birth year]  [id number]  Primary Physician Wendie Agreste, MD Primary Cardiologist: Lorretta Harp MD Renae Gloss  HPI:  Jeffery Clements is a delightful 60 year old mildly overweight divorced Caucasian male father of 2 children, grandfather one grandchild who works in [employer]. He was referred by Dr. Nyoka Cowden for cardiovascular evaluation because of progressive dyspnea. He basically has no cardiac risk factors. He has never smoked. There is no family history of heart disease although his mother did die pulmonary issues. He denies chest pain. He's had progressive dyspnea over the last year which is noticeable to him doing normal activities.   Current Outpatient Prescriptions  Medication Sig Dispense Refill  . clobetasol cream (TEMOVATE) [id number] % Apply 1 application topically 2 (two) times daily. 60 g 1  . levothyroxine (SYNTHROID, LEVOTHROID) 75 MCG tablet TAKE ONE TABLET BY MOUTH ONCE DAILY BEFORE  BREAKFAST 90 tablet 3  . Multiple Vitamins-Minerals (CENTRUM SILVER ADULT 50+) TABS Take by mouth.    . Triamcinolone Acetonide (TRIAMCINOLONE 0.1 % CREAM : EUCERIN) CREA Apply 1 application topically 3 (three) times daily. 1 each 3   No current facility-administered medications for this visit.     Not on File  Social History   Social History  . Marital status: Divorced    Spouse name: N/A  . Number of children: N/A  . 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 Smoking status: Never Smoker  . Smokeless tobacco: Not on file  . Alcohol use No  . Drug use: No  . Sexual activity: Not on file   Other Topics Concern  . Not on file   Social History Narrative   Divorced. Education: [employer]. Exercise: [employer] 3 times a week for 30 minutes.     Review of Systems: General: negative for chills, fever, night sweats or weight changes.  Cardiovascular: negative for chest  pain, dyspnea on exertion, edema, orthopnea, palpitations, paroxysmal nocturnal dyspnea or shortness of breath Dermatological: negative for rash Respiratory: negative for cough or wheezing Urologic: negative for hematuria Abdominal: negative for nausea, vomiting, diarrhea, bright red blood per rectum, melena, or hematemesis Neurologic: negative for visual changes, syncope, or dizziness All other systems reviewed and are otherwise negative except as noted above.    Blood pressure 125/85, pulse 72, height 5\' 8"  (1.727 m), weight 185 lb 3.2 oz (84 kg).  General appearance: alert and no distress Neck: no adenopathy, no carotid bruit, no JVD, supple, symmetrical, trachea midline and thyroid not enlarged, symmetric, no tenderness/mass/nodules Lungs: clear to auscultation bilaterally Heart: regular rate and rhythm, S1, S2 normal, no murmur, click, rub or gallop Extremities: extremities normal, atraumatic, no cyanosis or edema  EKG not performed today  ASSESSMENT AND PLAN:   Dyspnea on exertion [name] is a 60 year old gentleman referred for progressive dyspnea on exertion. He basically has no cardiac risk factors. He does not smoke. His mother did die of pulmonary causes. He denies chest pain. He has never had a heart attack or stroke. I'm worried that this may be ischemically mediated. I'm going to get a 2-D echocardiogram and an exercise Myoview stress test. If these are normal he'll need further pulmonary evaluation.      Lorretta Harp MD FACP,FACC,FAHA, FSCAI 05/20/2016 8:16 AM

## 2016-05-31 ENCOUNTER — Telehealth (HOSPITAL_COMMUNITY): Payer: Self-pay

## 2016-05-31 NOTE — Telephone Encounter (Signed)
Encounter complete. 

## 2016-06-02 ENCOUNTER — Ambulatory Visit (HOSPITAL_COMMUNITY)
Admission: RE | Admit: 2016-06-02 | Discharge: 2016-06-02 | Disposition: A | Discharge status: Home / Self Care | Payer: BLUE CROSS/BLUE SHIELD | Source: Ambulatory Visit | Attending: Cardiovascular Disease | Admitting: Cardiovascular Disease

## 2016-06-02 DIAGNOSIS — R0602 Shortness of breath: Secondary | ICD-10-CM | POA: Insufficient documentation

## 2016-06-02 DIAGNOSIS — R0600 Dyspnea, unspecified: Secondary | ICD-10-CM | POA: Diagnosis not present

## 2016-06-02 DIAGNOSIS — R0609 Other forms of dyspnea: Secondary | ICD-10-CM | POA: Insufficient documentation

## 2016-06-02 LAB — MYOCARDIAL PERFUSION IMAGING
CHL CUP MPHR: 159 {beats}/min
CHL CUP RESTING HR STRESS: 53 {beats}/min
CHL RATE OF PERCEIVED EXERTION: 17
CSEPED: 8 min
CSEPEW: 10.1 METS
CSEPHR: 90 %
Exercise duration (sec): 5 s
LV sys vol: 31 mL
LVDIAVOL: 73 mL (ref 62–150)
Peak HR: 144 {beats}/min
SRS: 1
SSS: 1
TID: 0.94

## 2016-06-02 MED ORDER — TECHNETIUM TC 99M TETROFOSMIN IV KIT
11.0000 | PACK | Freq: Once | INTRAVENOUS | Status: AC | PRN
  Administered 2016-06-02: 11 via INTRAVENOUS
  Filled 2016-06-02: qty 11

## 2016-06-02 MED ORDER — TECHNETIUM TC 99M TETROFOSMIN IV KIT
30.9000 | PACK | Freq: Once | INTRAVENOUS | Status: AC | PRN
  Administered 2016-06-02: 30.9 via INTRAVENOUS
  Filled 2016-06-02: qty 31

## 2016-06-06 ENCOUNTER — Other Ambulatory Visit: Payer: Self-pay

## 2016-06-06 ENCOUNTER — Ambulatory Visit (HOSPITAL_COMMUNITY): Payer: BLUE CROSS/BLUE SHIELD | Attending: Cardiology

## 2016-06-06 DIAGNOSIS — R0609 Other forms of dyspnea: Secondary | ICD-10-CM | POA: Insufficient documentation

## 2016-06-06 DIAGNOSIS — R0600 Dyspnea, unspecified: Secondary | ICD-10-CM | POA: Diagnosis not present

## 2016-06-06 DIAGNOSIS — I071 Rheumatic tricuspid insufficiency: Secondary | ICD-10-CM | POA: Diagnosis not present

## 2016-06-06 LAB — ECHOCARDIOGRAM COMPLETE
AVLVOTPG: 4 mmHg
Ao-asc: 29 cm
CHL CUP MV DEC (S): 246
CHL CUP TV REG PEAK VELOCITY: 208 cm/s
EERAT: 6.62
EWDT: 246 ms
FS: 32 % (ref 28–44)
IV/PV OW: 1.06
LA ID, A-P, ES: 38 mm
LA vol index: 27.3 mL/m2
LADIAMINDEX: 1.92 cm/m2
LAVOL: 54 mL
LAVOLA4C: 47 mL
LEFT ATRIUM END SYS DIAM: 38 mm
LV SIMPSON'S DISK: 61
LV TDI E'LATERAL: 11.1
LV TDI E'MEDIAL: 6.82
LV dias vol index: 38 mL/m2
LV dias vol: 75 mL (ref 62–150)
LV e' LATERAL: 11.1 cm/s
LV sys vol index: 15 mL/m2
LVEEAVG: 6.62
LVEEMED: 6.62
LVOT SV: 63 mL
LVOT VTI: 18.1 cm
LVOT area: 3.46 cm2
LVOT diameter: 21 mm
LVOT peak vel: 98.5 cm/s
LVSYSVOL: 29 mL (ref 21–61)
MVPG: 2 mmHg
MVPKAVEL: 58.7 m/s
MVPKEVEL: 73.5 m/s
PW: 9.1 mm — AB (ref 0.6–1.1)
RV LATERAL S' VELOCITY: 10.5 cm/s
RV sys press: 20 mmHg
Stroke v: 46 ml
TRMAXVEL: 208 cm/s

## 2016-06-09 ENCOUNTER — Encounter: Payer: Self-pay | Admitting: *Deleted

## 2017-04-16 IMAGING — NM NM MISC PROCEDURE
6 series · 36 of 36 positions shown · non-contrast
Comparison: none

[Series 1: wbr_r-proj_st wbr rest · 6.40mm/px · 6 of 64 frames shown]
[frame 6/64]
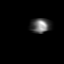
[frame 16/64]
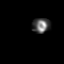
[frame 27/64]
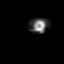
[frame 38/64]
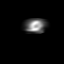
[frame 48/64]
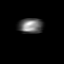
[frame 59/64]
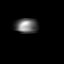

[Series 1: wbr rest · 6.40mm/px · 6 of 64 frames shown]
[frame 6/64]
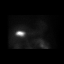
[frame 16/64]
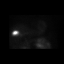
[frame 27/64]
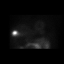
[frame 38/64]
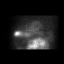
[frame 48/64]
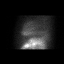
[frame 59/64]
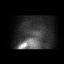

[Series 2: wbr stress-gsp · 6.40mm/px · 6 of 512 frames shown]
[frame 43/512]
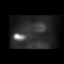
[frame 128/512]
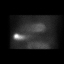
[frame 214/512]
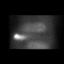
[frame 299/512]
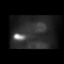
[frame 384/512]
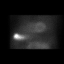
[frame 470/512]
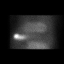

[Series 2: wbr_s-proj_st wbr stress-gsp · 6.40mm/px · 6 of 512 frames shown]
[frame 43/512]
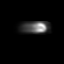
[frame 128/512]
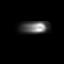
[frame 214/512]
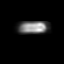
[frame 299/512]
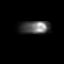
[frame 384/512]
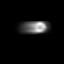
[frame 470/512]
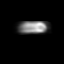

[Series 3: wbr stress-sum-em · 6.40mm/px · 6 of 64 frames shown]
[frame 6/64]
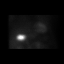
[frame 16/64]
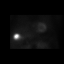
[frame 27/64]
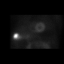
[frame 38/64]
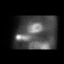
[frame 48/64]
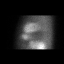
[frame 59/64]
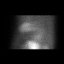

[Series 3: wbr_s-proj_st wbr stress-sum-em · 6.40mm/px · 6 of 64 frames shown]
[frame 6/64]
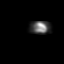
[frame 16/64]
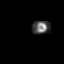
[frame 27/64]
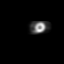
[frame 38/64]
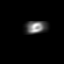
[frame 48/64]
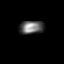
[frame 59/64]
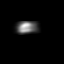

[36 of 36 positions shown; findings below may reference images not displayed]

Canned report from images found in remote index.

Refer to host system for actual result text.

## 2017-05-04 ENCOUNTER — Ambulatory Visit (INDEPENDENT_AMBULATORY_CARE_PROVIDER_SITE_OTHER): Payer: BLUE CROSS/BLUE SHIELD | Admitting: Family Medicine

## 2017-05-04 ENCOUNTER — Encounter: Payer: Self-pay | Admitting: Family Medicine

## 2017-05-04 VITALS — BP 119/76 | HR 76 | Temp 98.1°F | Resp 16 | Ht 68.31 in | Wt 179.8 lb

## 2017-05-04 DIAGNOSIS — D229 Melanocytic nevi, unspecified: Secondary | ICD-10-CM

## 2017-05-04 DIAGNOSIS — Z13220 Encounter for screening for lipoid disorders: Secondary | ICD-10-CM | POA: Diagnosis not present

## 2017-05-04 DIAGNOSIS — Z131 Encounter for screening for diabetes mellitus: Secondary | ICD-10-CM | POA: Diagnosis not present

## 2017-05-04 DIAGNOSIS — E039 Hypothyroidism, unspecified: Secondary | ICD-10-CM

## 2017-05-04 DIAGNOSIS — Z0000 Encounter for general adult medical examination without abnormal findings: Secondary | ICD-10-CM | POA: Diagnosis not present

## 2017-05-04 MED ORDER — LEVOTHYROXINE SODIUM 75 MCG PO TABS: ORAL_TABLET | ORAL | 3 refills | Status: DC

## 2017-05-04 NOTE — Progress Notes (Signed)
Subjective:  By signing my name below, I, Jeffery Clements, attest that this documentation has been prepared under the direction and in the presence of Jeffery Ray, MD. Electronically Signed: [name], [place]. 05/04/2017 , 4:28 PM .  Patient was seen in Room 12 .   Patient ID: Jeffery Clements, male    DOB: 05-Oct-1955, 61 y.o.   MRN: [id number] Chief Complaint  Patient presents with  . Annual Exam   HPI [name] is a 61 y.o. male Here for annual physical. His last physical with me was in Sept 2017. He has history of hypothyroidism, tinnitus with sensory neuro hearing loss (followed by [facility]), and dyspnea on exertion. He had fasting Clements work drawn this morning.   He has hearing aids in place now.   Dyspnea on exertion Discussed last Sept, EKG without acute findings. Reported family history for pulmonary fibrosis. He was seen by cardiology Sept 29th, 2017 due to progressive dyspnea on exertion. He had echocardiogram and exercise stress test in Sept 2017, essentially normal test with EF 58%, normal wall motion, and normal stress nuclear study with no ischemia.   Cancer Screening Colonoscopy: last done in 2013; single polyp removed, and hemorrhoids noted, repeat in 5 years. He is planning to schedule this.   Prostate cancer screening: followed by urology, Dr. Pilar Jarvis; seen 1 month ago.  Lab Results  Component Value Date   PSA 0.5 04/26/2016   PSA 1.46 04/14/2015   PSA 2.09 04/01/2015   Noted to have BPH without obstruction symptoms.   Skin cancer screening: he mentions having itchy spots on frontal scalp; he's scratched the area and wouldn't heal completely. He's noticed peeling on his nose as well. He's seen dermatologist (Dr. Allyson Sabal dermatology) about 3-4 years ago for seborrheic keratosis. He denies family history of skin cancer. He denies alcohol use.   Immunizations Immunization History  Administered Date(s) Administered  . Influenza-Unspecified  05/21/2014  . Tdap 02/15/2012  . Zoster 03/22/2014   Shingles: He received shingles vaccine about 2 years ago in 2016.   Depression Depression screen [facility] 2/9 05/04/2017 05/04/2017 04/28/2016 04/04/2016 04/14/2015  Decreased Interest 0 0 0 0 0  Down, Depressed, Hopeless 0 0 0 0 0  PHQ - 2 Score 0 0 0 0 0    Vision  Visual Acuity Screening   Right eye Left eye Both eyes  Without correction:     With correction: 20/20 20/15 20/15    He last saw eye doctor 2 months ago; reports al testing normal.   Dentist He's followed by dentist, seen every 6 months.   Exercise He denies much regular exercise. He does push mow his lawn for 30 minutes, up to once a week.   Body mass index is 27.09 kg/m.   Hypothyroidism He takes synthroid 61mcg. He denies any change in heat/cold tolerance, skin changes or hair loss.   Lab Results  Component Value Date   TSH 3.62 04/26/2016   Family - children Stepdaughters - in their 53s Daughter - in her 4s Son - is 5 years old, lives with patient.   Patient Active Problem List   Diagnosis Date Noted  . Dyspnea on exertion 05/20/2016  . Hematuria 04/14/2015  . Hypertriglyceridemia 09/11/2012  . Rectal fissure 09/11/2012  . Colon polyps 09/11/2012   History reviewed. No pertinent past medical history. Past Surgical History:  Procedure Laterality Date  . VASECTOMY     No Known Allergies Prior to Admission medications   Medication Sig Start Date  End Date Taking? Authorizing Provider  clobetasol cream (TEMOVATE) 4.09 % Apply 1 application topically 2 (two) times daily. 04/07/14  Yes Daub, Loura Back, MD  levothyroxine (SYNTHROID, LEVOTHROID) 75 MCG tablet TAKE ONE TABLET BY MOUTH ONCE DAILY BEFORE  BREAKFAST 04/28/16  Yes Wendie Agreste, MD  Multiple Vitamins-Minerals (CENTRUM SILVER ADULT 50+) TABS Take by mouth.   Yes [provider]  Triamcinolone Acetonide (TRIAMCINOLONE 0.1 % CREAM : EUCERIN) CREA Apply 1 application topically 3 (three)  times daily. 07/10/14  Yes Darlyne Russian, MD   Social History   Social History  . Marital status: Divorced    Spouse name: N/A  . Number of children: N/A  . 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 Smoking status: Never Smoker  . Smokeless tobacco: Never Used  . Alcohol use No  . Drug use: No  . Sexual activity: Not on file   Other Topics Concern  . Not on file   Social History Narrative   Divorced. Education: [employer]. Exercise: Bouflex 3 times a week for 30 minutes.   Review of Systems 13 point ROS - negative, except for hearing loss and tinnitus     Objective:   Physical Exam  Constitutional: He is oriented to person, place, and time. He appears well-developed and well-nourished.  HENT:  Head: Normocephalic and atraumatic.  Right Ear: External ear normal.  Left Ear: External ear normal.  Mouth/Throat: Oropharynx is clear and moist.  Scalp: dry appearing skin on the right lateral nose, and appears to have 2 small scars on his frontal scalp bilaterally Nose: a few telangiectasia   Eyes: Pupils are equal, round, and reactive to light. Conjunctivae and EOM are normal.  Neck: Normal range of motion. Neck supple. No thyromegaly present.  Cardiovascular: Normal rate, regular rhythm, normal heart sounds and intact distal pulses.   Pulmonary/Chest: Effort normal and breath sounds normal. No respiratory distress. He has no wheezes.  Abdominal: Soft. He exhibits no distension. There is no tenderness.  Musculoskeletal: Normal range of motion. He exhibits no edema or tenderness.  Lymphadenopathy:    He has no cervical adenopathy.  Neurological: He is alert and oriented to person, place, and time. He has normal reflexes.  Skin: Skin is warm and dry.  Left leg: 4-88mm slightly dry appearing nevus over left medial thigh Right leg: few scattered smaller nevi on the right Right arm: there is an approximately  1.2cm nevus, very light tan pink color on his right forearm, but area is flat  Psychiatric: He has a normal mood and affect. His behavior is normal.  Vitals reviewed.   Vitals:   05/04/17 1521  BP: 119/76  Pulse: 76  Resp: 16  Temp: 98.1 F (36.7 C)  TempSrc: Oral  SpO2: 96%  Weight: 179 lb 12.8 oz (81.6 kg)  Height: 5' 8.31" (1.735 m)      Assessment & Plan:   Othman Masur is a 61 y.o. male Annual physical exam  - -anticipatory guidance as below in AVS, screening labs above. Health maintenance items as above in HPI discussed/recommended as applicable.   - Call GI for follow-up colonoscopy. Continue urology follow-up. Shinglrix discussed, decided to wait at this point  Hypothyroidism, unspecified type - Plan: TSH  - check tsh, Continue same dose of Synthroid  Screening for diabetes mellitus - Plan: Comprehensive metabolic panel  Screening for hyperlipidemia - Plan: Lipid panel  Nevus -  Plan: Ambulatory referral to Dermatology  - Few scattered nevi, refer to dermatology for evaluation.  Meds ordered this encounter  Medications  . levothyroxine (SYNTHROID, LEVOTHROID) 75 MCG tablet    Sig: TAKE ONE TABLET BY MOUTH ONCE DAILY BEFORE  BREAKFAST    Dispense:  90 tablet    Refill:  3   Patient Instructions    I will refer you to dermatology. Call gastroenterologist to schedule appointment for repeat colonoscopy. Continue routine follow-up with your urologist. At any point we can write for the newer shingles vaccine.  No change in medications for now.  Thank you for coming in today.  Keeping you healthy  Get these tests  Clements pressure- Have your Clements pressure checked once a year by your healthcare provider.  Normal Clements pressure is 120/80  Weight- Have your body mass index (BMI) calculated to screen for obesity.  BMI is a measure of body fat based on height and weight. You can also calculate your own BMI at ViewBanking.si.  Cholesterol- Have your  cholesterol checked every year.  Diabetes- Have your Clements sugar checked regularly if you have high Clements pressure, high cholesterol, have a family history of diabetes or if you are overweight.  Screening for Colon Cancer- Colonoscopy starting at age 20.  Screening may begin sooner depending on your family history and other health conditions. Follow up colonoscopy as directed by your Gastroenterologist.  Screening for Prostate Cancer- Both Clements work (PSA) and a rectal exam help screen for Prostate Cancer.  Screening begins at age 22 with African-American men and at age 77 with Caucasian men.  Screening may begin sooner depending on your family history.  Take these medicines  Aspirin- One aspirin daily can help prevent Heart disease and Stroke.  Flu shot- Every fall.  Tetanus- Every 10 years.  Zostavax- Once after the age of 57 to prevent Shingles.  Pneumonia shot- Once after the age of 22; if you are younger than 55, ask your healthcare provider if you need a Pneumonia shot.  Take these steps  Don't smoke- If you do smoke, talk to your doctor about quitting.  For tips on how to quit, go to www.smokefree.gov or call 1-800-QUIT-NOW.  Be physically active- Exercise 5 days a week for at least 30 minutes.  If you are not already physically active start slow and gradually work up to 30 minutes of moderate physical activity.  Examples of moderate activity include walking briskly, mowing the yard, dancing, swimming, bicycling, etc.  Eat a healthy diet- Eat a variety of healthy food such as fruits, vegetables, low fat milk, low fat cheese, yogurt, lean meant, poultry, fish, beans, tofu, etc. For more information go to www.thenutritionsource.org  Drink alcohol in moderation- Limit alcohol intake to less than two drinks a day. Never drink and drive.  Dentist- Brush and floss twice daily; visit your dentist twice a year.  Depression- Your emotional health is as important as your physical health.  If you're feeling down, or losing interest in things you would normally enjoy please talk to your healthcare provider.  Eye exam- Visit your eye doctor every year.  Safe sex- If you may be exposed to a sexually transmitted infection, use a condom.  Seat belts- Seat belts can save your life; always wear one.  Smoke/Carbon Monoxide detectors- These detectors need to be installed on the appropriate level of your home.  Replace batteries at least once a year.  Skin cancer- When out in the sun, cover up and use  sunscreen 15 SPF or higher.  Violence- If anyone is threatening you, please tell your healthcare provider.  Living Will/ Health care power of attorney- Speak with your healthcare provider and family.   IF you received an x-Clements today, you will receive an invoice from [facility]. Please contact [facility] at [phone] with questions or concerns regarding your invoice.   IF you received labwork today, you will receive an invoice from [place]. Please contact [facility] at [phone] with questions or concerns regarding your invoice.   Our billing staff will not be able to assist you with questions regarding bills from these companies.  You will be contacted with the lab results as soon as they are available. The fastest way to get your results is to activate your My Chart account. Instructions are located on the last page of this paperwork. If you have not heard from [place] regarding the results in 2 weeks, please contact this office.     I personally performed the services described in this documentation, which was scribed in my presence. The recorded information has been reviewed and considered for accuracy and completeness, addended by me as needed, and agree with information above.  Signed,   Jeffery Ray, MD [facility] [place].  05/06/17 12:43 PM

## 2017-05-04 NOTE — Patient Instructions (Addendum)
I will refer you to dermatology. Call gastroenterologist to schedule appointment for repeat colonoscopy. Continue routine follow-up with your urologist. At any point we can write for the newer shingles vaccine.  No change in medications for now.  Thank you for coming in today.  Keeping you healthy  Get these tests  Blood pressure- Have your blood pressure checked once a year by your healthcare provider.  Normal blood pressure is 120/80  Weight- Have your body mass index (BMI) calculated to screen for obesity.  BMI is a measure of body fat based on height and weight. You can also calculate your own BMI at ViewBanking.si.  Cholesterol- Have your cholesterol checked every year.  Diabetes- Have your blood sugar checked regularly if you have high blood pressure, high cholesterol, have a family history of diabetes or if you are overweight.  Screening for Colon Cancer- Colonoscopy starting at age 63.  Screening may begin sooner depending on your family history and other health conditions. Follow up colonoscopy as directed by your Gastroenterologist.  Screening for Prostate Cancer- Both blood work (PSA) and a rectal exam help screen for Prostate Cancer.  Screening begins at age 63 with African-American men and at age 63 with Caucasian men.  Screening may begin sooner depending on your family history.  Take these medicines  Aspirin- One aspirin daily can help prevent Heart disease and Stroke.  Flu shot- Every fall.  Tetanus- Every 10 years.  Zostavax- Once after the age of 63 to prevent Shingles.  Pneumonia shot- Once after the age of 63; if you are younger than 78, ask your healthcare provider if you need a Pneumonia shot.  Take these steps  Don't smoke- If you do smoke, talk to your doctor about quitting.  For tips on how to quit, go to www.smokefree.gov or call 1-800-QUIT-NOW.  Be physically active- Exercise 5 days a week for at least 30 minutes.  If you are not already  physically active start slow and gradually work up to 30 minutes of moderate physical activity.  Examples of moderate activity include walking briskly, mowing the yard, dancing, swimming, bicycling, etc.  Eat a healthy diet- Eat a variety of healthy food such as fruits, vegetables, low fat milk, low fat cheese, yogurt, lean meant, poultry, fish, beans, tofu, etc. For more information go to www.thenutritionsource.org  Drink alcohol in moderation- Limit alcohol intake to less than two drinks a day. Never drink and drive.  Dentist- Brush and floss twice daily; visit your dentist twice a year.  Depression- Your emotional health is as important as your physical health. If you're feeling down, or losing interest in things you would normally enjoy please talk to your healthcare provider.  Eye exam- Visit your eye doctor every year.  Safe sex- If you may be exposed to a sexually transmitted infection, use a condom.  Seat belts- Seat belts can save your life; always wear one.  Smoke/Carbon Monoxide detectors- These detectors need to be installed on the appropriate level of your home.  Replace batteries at least once a year.  Skin cancer- When out in the sun, cover up and use sunscreen 15 SPF or higher.  Violence- If anyone is threatening you, please tell your healthcare provider.  Living Will/ Health care power of attorney- Speak with your healthcare provider and family.   IF you received an x-ray today, you will receive an invoice from [facility]. Please contact [facility] at [phone] with questions or concerns regarding your invoice.   IF you  received labwork today, you will receive an invoice from [place]. Please contact [facility] at [phone] with questions or concerns regarding your invoice.   Our billing staff will not be able to assist you with questions regarding bills from these companies.  You will be contacted with the lab results as soon as they are  available. The fastest way to get your results is to activate your My Chart account. Instructions are located on the last page of this paperwork. If you have not heard from [place] regarding the results in 2 weeks, please contact this office.

## 2017-05-05 LAB — COMPREHENSIVE METABOLIC PANEL
ALK PHOS: 101 IU/L (ref 39–117)
ALT: 32 IU/L (ref 0–44)
AST: 25 IU/L (ref 0–40)
Albumin/Globulin Ratio: 1.7 (ref 1.2–2.2)
Albumin: 4.3 g/dL (ref 3.6–4.8)
BUN/Creatinine Ratio: 15 (ref 10–24)
BUN: 14 mg/dL (ref 8–27)
Bilirubin Total: 1 mg/dL (ref 0.0–1.2)
CO2: 23 mmol/L (ref 20–29)
Calcium: 9.4 mg/dL (ref 8.6–10.2)
Chloride: 103 mmol/L (ref 96–106)
Creatinine, Ser: 0.91 mg/dL (ref 0.76–1.27)
GFR calc non Af Amer: 90 mL/min/{1.73_m2} (ref >59)
GFR, EST AFRICAN AMERICAN: 104 mL/min/{1.73_m2} (ref >59)
GLUCOSE: 99 mg/dL (ref 65–99)
Globulin, Total: 2.5 g/dL (ref 1.5–4.5)
Potassium: 4.5 mmol/L (ref 3.5–5.2)
Sodium: 141 mmol/L (ref 134–144)
TOTAL PROTEIN: 6.8 g/dL (ref 6.0–8.5)

## 2017-05-05 LAB — LIPID PANEL
CHOL/HDL RATIO: 3.7 ratio (ref 0.0–5.0)
Cholesterol, Total: 200 mg/dL — ABNORMAL HIGH (ref 100–199)
HDL: 54 mg/dL (ref >39)
LDL CALC: 122 mg/dL — AB (ref 0–99)
TRIGLYCERIDES: 122 mg/dL (ref 0–149)
VLDL Cholesterol Cal: 24 mg/dL (ref 5–40)

## 2017-05-05 LAB — TSH: TSH: 2.75 u[IU]/mL (ref 0.450–4.500)

## 2017-08-03 LAB — HM COLONOSCOPY

## 2017-08-21 ENCOUNTER — Encounter: Payer: Self-pay | Admitting: Emergency Medicine

## 2017-08-21 ENCOUNTER — Other Ambulatory Visit: Payer: Self-pay

## 2017-08-21 ENCOUNTER — Ambulatory Visit: Payer: BLUE CROSS/BLUE SHIELD | Admitting: Emergency Medicine

## 2017-08-21 VITALS — BP 104/66 | HR 74 | Temp 98.4°F | Resp 16 | Ht 68.5 in | Wt 180.0 lb

## 2017-08-21 DIAGNOSIS — R062 Wheezing: Secondary | ICD-10-CM

## 2017-08-21 DIAGNOSIS — R059 Cough, unspecified: Secondary | ICD-10-CM

## 2017-08-21 DIAGNOSIS — R05 Cough: Secondary | ICD-10-CM

## 2017-08-21 DIAGNOSIS — J22 Unspecified acute lower respiratory infection: Secondary | ICD-10-CM | POA: Diagnosis not present

## 2017-08-21 MED ORDER — PREDNISONE 20 MG PO TABS
40.0000 mg | ORAL_TABLET | Freq: Every day | ORAL | 0 refills | Status: AC
Start: 2017-08-21 — End: 2017-08-26

## 2017-08-21 MED ORDER — PROMETHAZINE-CODEINE 6.25-10 MG/5ML PO SYRP: 5.0000 mL | ORAL_SOLUTION | Freq: Every evening | ORAL | 0 refills | Status: DC | PRN

## 2017-08-21 MED ORDER — AZITHROMYCIN 250 MG PO TABS: ORAL_TABLET | ORAL | 0 refills | Status: DC

## 2017-08-21 MED ORDER — BENZONATATE 200 MG PO CAPS: 200.0000 mg | ORAL_CAPSULE | Freq: Two times a day (BID) | ORAL | 0 refills | Status: DC | PRN

## 2017-08-21 NOTE — Progress Notes (Signed)
Jeffery Clements 61 y.o.   Chief Complaint  Patient presents with  . Cough    6-7 days nonproductive    HISTORY OF PRESENT ILLNESS: This is a 61 y.o. male complaining of cough x 6-7 days.  Cough  This is a new problem. The current episode started in the past 7 days. The problem has been gradually worsening. The problem occurs every few minutes. The cough is productive of sputum. Associated symptoms include nasal congestion, rhinorrhea, a sore throat and wheezing (end of expiration). Pertinent negatives include no chest pain, chills, ear congestion, ear pain, fever, headaches, heartburn, hemoptysis, myalgias, rash or shortness of breath.     Prior to Admission medications   Medication Sig Start Date End Date Taking? Authorizing Provider  clobetasol cream (TEMOVATE) 2.99 % Apply 1 application topically 2 (two) times daily. 04/07/14  Yes Daub, Loura Back, MD  levothyroxine (SYNTHROID, LEVOTHROID) 75 MCG tablet TAKE ONE TABLET BY MOUTH ONCE DAILY BEFORE  BREAKFAST 05/04/17  Yes Wendie Agreste, MD  Multiple Vitamins-Minerals (CENTRUM SILVER ADULT 50+) TABS Take by mouth.   Yes [provider]  Triamcinolone Acetonide (TRIAMCINOLONE 0.1 % CREAM : EUCERIN) CREA Apply 1 application topically 3 (three) times daily. 07/10/14  Yes Darlyne Russian, MD    Not on File  Patient Active Problem List   Diagnosis Date Noted  . Dyspnea on exertion 05/20/2016  . Hematuria 04/14/2015  . Hypertriglyceridemia 09/11/2012  . Rectal fissure 09/11/2012  . Colon polyps 09/11/2012    No past medical history on file.  Past Surgical History:  Procedure Laterality Date  . VASECTOMY      Social History   Socioeconomic History  . Marital status: Divorced    Spouse name: Not on file  . Number of children: Not on file  . Years of education: Not on file  . Highest education level: Not on file  Social Needs  . Financial resource strain: Not on file  . Food insecurity - worry: Not on file  .  Food insecurity - inability: Not on file  . Transportation needs - medical: Not on file  . Transportation needs - non-medical: Not on file  Occupational History  . Occupation: Aeronautical engineer: [employer]  Tobacco Use  . Smoking status: Never Smoker  . Smokeless tobacco: Never Used  Substance and Sexual Activity  . Alcohol use: No  . Drug use: No  . Sexual activity: Not on file  Other Topics Concern  . Not on file  Social History Narrative   Divorced. Education: [employer]. Exercise: Bouflex 3 times a week for 30 minutes.    Family History  Problem Relation Age of Onset  . Lung disease Mother      Review of Systems  Constitutional: Negative for chills and fever.  HENT: Positive for congestion, rhinorrhea and sore throat. Negative for ear pain and nosebleeds.   Respiratory: Positive for cough and wheezing (end of expiration). Negative for hemoptysis and shortness of breath.   Cardiovascular: Negative for chest pain.  Gastrointestinal: Negative.  Negative for abdominal pain, diarrhea, heartburn, nausea and vomiting.  Genitourinary: Negative.  Negative for dysuria and hematuria.  Musculoskeletal: Positive for back pain and neck pain. Negative for myalgias.  Skin: Negative for rash.  Neurological: Negative for headaches.  Endo/Heme/Allergies: Negative.   All other systems reviewed and are negative.   Vitals:   08/21/17 1133  BP: 104/66  Pulse: 74  Resp: 16  Temp: 98.4 F (36.9 C)  SpO2: 97%    Physical Exam  Constitutional: He is oriented to person, place, and time. He appears well-developed and well-nourished.  HENT:  Head: Normocephalic and atraumatic.  Right Ear: Tympanic membrane, external ear and ear canal normal.  Left Ear: Tympanic membrane, external ear and ear canal normal.  Nose: Nose normal.  Mouth/Throat: Oropharynx is clear and moist.  Eyes: Conjunctivae and EOM are normal. Pupils are equal, round, and reactive to light.  Neck: Normal  range of motion. Neck supple. No JVD present. No thyromegaly present.  Cardiovascular: Normal rate, regular rhythm and normal heart sounds.  Pulmonary/Chest: Effort normal and breath sounds normal. No respiratory distress.  Abdominal: Soft. Bowel sounds are normal. He exhibits no distension. There is no tenderness.  Musculoskeletal: Normal range of motion.  Lymphadenopathy:    He has no cervical adenopathy.  Neurological: He is alert and oriented to person, place, and time. No sensory deficit. He exhibits normal muscle tone.  Skin: Skin is warm and dry. Capillary refill takes less than 2 seconds. No rash noted.  Psychiatric: He has a normal mood and affect. His behavior is normal.  Vitals reviewed.  A total of 25 minutes  was spent in the room with the patient, greater than 50% of which was in counseling/coordination of care.   ASSESSMENT & PLAN: Jeffery Clements was seen today for cough.  Diagnoses and all orders for this visit:  Cough -     benzonatate (TESSALON) 200 MG capsule; Take 1 capsule (200 mg total) by mouth 2 (two) times daily as needed for cough. -     promethazine-codeine (PHENERGAN WITH CODEINE) 6.25-10 MG/5ML syrup; Take 5 mLs by mouth at bedtime as needed for cough.  Lower respiratory infection -     azithromycin (ZITHROMAX) 250 MG tablet; Sig as indicated  Wheezing on exhalation -     predniSONE (DELTASONE) 20 MG tablet; Take 2 tablets (40 mg total) by mouth daily with breakfast for 5 days.    Patient Instructions       IF you received an x-ray today, you will receive an invoice from [facility]. Please contact [facility] at [phone] with questions or concerns regarding your invoice.   IF you received labwork today, you will receive an invoice from [place]. Please contact [facility] at [phone] with questions or concerns regarding your invoice.   Our billing staff will not be able to assist you with questions regarding bills from these  companies.  You will be contacted with the lab results as soon as they are available. The fastest way to get your results is to activate your My Chart account. Instructions are located on the last page of this paperwork. If you have not heard from [place] regarding the results in 2 weeks, please contact this office.     Cough, Adult A cough helps to clear your throat and lungs. A cough may last only 2-3 weeks (acute), or it may last longer than 8 weeks (chronic). Many different things can cause a cough. A cough may be a sign of an illness or another medical condition. Follow these instructions at home:  Pay attention to any changes in your cough.  Take medicines only as told by your doctor. ? If you were prescribed an antibiotic medicine, take it as told by your doctor. Do not stop taking it even if you start to feel better. ? Talk with your doctor before you try using a cough medicine.  Drink enough fluid to keep your pee (urine)  clear or pale yellow.  If the air is dry, use a cold steam vaporizer or humidifier in your home.  Stay away from things that make you cough at work or at home.  If your cough is worse at night, try using extra pillows to raise your head up higher while you sleep.  Do not smoke, and try not to be around smoke. If you need help quitting, ask your doctor.  Do not have caffeine.  Do not drink alcohol.  Rest as needed. Contact a doctor if:  You have new problems (symptoms).  You cough up yellow fluid (pus).  Your cough does not get better after 2-3 weeks, or your cough gets worse.  Medicine does not help your cough and you are not sleeping well.  You have pain that gets worse or pain that is not helped with medicine.  You have a fever.  You are losing weight and you do not know why.  You have night sweats. Get help right away if:  You cough up blood.  You have trouble breathing.  Your heartbeat is very fast. This information is not intended to  replace advice given to you by your health care provider. Make sure you discuss any questions you have with your health care provider. Document Released: 04/21/2011 Document Revised: 01/14/2016 Document Reviewed: 10/15/2014 Elsevier Interactive Patient Education  2018 Elsevier Inc.      Agustina Caroli, MD [facility]

## 2017-08-21 NOTE — Patient Instructions (Addendum)
     IF you received an x-ray today, you will receive an invoice from [facility]. Please contact [facility] at [phone] with questions or concerns regarding your invoice.   IF you received labwork today, you will receive an invoice from [facility]. Please contact [facility] at [phone] with questions or concerns regarding your invoice.   Our billing staff will not be able to assist you with questions regarding bills from these companies.  You will be contacted with the lab results as soon as they are available. The fastest way to get your results is to activate your My Chart account. Instructions are located on the last page of this paperwork. If you have not heard from us regarding the results in 2 weeks, please contact this office.     Cough, Adult A cough helps to clear your throat and lungs. A cough may last only 2-3 weeks (acute), or it may last longer than 8 weeks (chronic). Many different things can cause a cough. A cough may be a sign of an illness or another medical condition. Follow these instructions at home:  Pay attention to any changes in your cough.  Take medicines only as told by your doctor. ? If you were prescribed an antibiotic medicine, take it as told by your doctor. Do not stop taking it even if you start to feel better. ? Talk with your doctor before you try using a cough medicine.  Drink enough fluid to keep your pee (urine) clear or pale yellow.  If the air is dry, use a cold steam vaporizer or humidifier in your home.  Stay away from things that make you cough at work or at home.  If your cough is worse at night, try using extra pillows to raise your head up higher while you sleep.  Do not smoke, and try not to be around smoke. If you need help quitting, ask your doctor.  Do not have caffeine.  Do not drink alcohol.  Rest as needed. Contact a doctor if:  You have new problems (symptoms).  You cough up yellow fluid  (pus).  Your cough does not get better after 2-3 weeks, or your cough gets worse.  Medicine does not help your cough and you are not sleeping well.  You have pain that gets worse or pain that is not helped with medicine.  You have a fever.  You are losing weight and you do not know why.  You have night sweats. Get help right away if:  You cough up blood.  You have trouble breathing.  Your heartbeat is very fast. This information is not intended to replace advice given to you by your health care provider. Make sure you discuss any questions you have with your health care provider. Document Released: 04/21/2011 Document Revised: 01/14/2016 Document Reviewed: 10/15/2014 Elsevier Interactive Patient Education  2018 Elsevier Inc.  

## 2018-05-09 ENCOUNTER — Encounter: Payer: Self-pay | Admitting: Family Medicine

## 2018-05-09 ENCOUNTER — Other Ambulatory Visit: Payer: Self-pay

## 2018-05-09 ENCOUNTER — Ambulatory Visit (INDEPENDENT_AMBULATORY_CARE_PROVIDER_SITE_OTHER): Payer: Managed Care, Other (non HMO) | Admitting: Family Medicine

## 2018-05-09 ENCOUNTER — Ambulatory Visit
Admission: RE | Admit: 2018-05-09 | Discharge: 2018-05-09 | Disposition: A | Discharge status: Home / Self Care | Payer: Managed Care, Other (non HMO) | Source: Ambulatory Visit | Attending: Family Medicine | Admitting: Family Medicine

## 2018-05-09 VITALS — BP 115/77 | HR 60 | Temp 98.5°F | Ht 68.0 in | Wt 184.4 lb

## 2018-05-09 DIAGNOSIS — Z0001 Encounter for general adult medical examination with abnormal findings: Secondary | ICD-10-CM | POA: Diagnosis not present

## 2018-05-09 DIAGNOSIS — M79621 Pain in right upper arm: Secondary | ICD-10-CM | POA: Diagnosis not present

## 2018-05-09 DIAGNOSIS — Z23 Encounter for immunization: Secondary | ICD-10-CM | POA: Diagnosis not present

## 2018-05-09 DIAGNOSIS — E785 Hyperlipidemia, unspecified: Secondary | ICD-10-CM | POA: Diagnosis not present

## 2018-05-09 DIAGNOSIS — Z0000 Encounter for general adult medical examination without abnormal findings: Secondary | ICD-10-CM

## 2018-05-09 DIAGNOSIS — E039 Hypothyroidism, unspecified: Secondary | ICD-10-CM

## 2018-05-09 MED ORDER — ZOSTER VAC RECOMB ADJUVANTED 50 MCG/0.5ML IM SUSR: 0.5000 mL | Freq: Once | INTRAMUSCULAR | 1 refills | Status: AC

## 2018-05-09 MED ORDER — LEVOTHYROXINE SODIUM 75 MCG PO TABS: ORAL_TABLET | ORAL | 3 refills | Status: DC

## 2018-05-09 NOTE — Patient Instructions (Addendum)
New shingles vaccine sent to your pharmacy.   Exercise most days of the week, low intensity. Goal of 150 minutes per week.   No change in med doses for now.   Tylenol if needed for arm pain intermittently, but recheck in next 6 weeks if not improving. Please have xray performed at [facility] imaging:  [address]. [phone]   Return to the clinic or go to the nearest emergency room if any of your symptoms worsen or new symptoms occur.  Keeping you healthy  Get these tests  Blood pressure- Have your blood pressure checked once a year by your healthcare provider.  Normal blood pressure is 120/80  Weight- Have your body mass index (BMI) calculated to screen for obesity.  BMI is a measure of body fat based on height and weight. You can also calculate your own BMI at [email or web address].  Cholesterol- Have your cholesterol checked every year.  Diabetes- Have your blood sugar checked regularly if you have high blood pressure, high cholesterol, have a family history of diabetes or if you are overweight.  Screening for Colon Cancer- Colonoscopy starting at age 51.  Screening may begin sooner depending on your family history and other health conditions. Follow up colonoscopy as directed by your Gastroenterologist.  Screening for Prostate Cancer- Both blood work (PSA) and a rectal exam help screen for Prostate Cancer.  Screening begins at age 22 with African-American men and at age 11 with Caucasian men.  Screening may begin sooner depending on your family history.  Take these medicines  Aspirin- One aspirin daily can help prevent Heart disease and Stroke.  Flu shot- Every fall.  Tetanus- Every 10 years.  Zostavax- Once after the age of 23 to prevent Shingles.  Pneumonia shot- Once after the age of 23; if you are younger than 20, ask your healthcare provider if you need a Pneumonia shot.  Take these steps  Don't smoke- If you do smoke, talk to  your doctor about quitting.  For tips on how to quit, go to www.smokefree.gov or call 1-800-QUIT-NOW.  Be physically active- Exercise 5 days a week for at least 30 minutes.  If you are not already physically active start slow and gradually work up to 30 minutes of moderate physical activity.  Examples of moderate activity include walking briskly, mowing the yard, dancing, swimming, bicycling, etc.  Eat a healthy diet- Eat a variety of healthy food such as fruits, vegetables, low fat milk, low fat cheese, yogurt, lean meant, poultry, fish, beans, tofu, etc. For more information go to www.thenutritionsource.org  Drink alcohol in moderation- Limit alcohol intake to less than two drinks a day. Never drink and drive.  Dentist- Brush and floss twice daily; visit your dentist twice a year.  Depression- Your emotional health is as important as your physical health. If you're feeling down, or losing interest in things you would normally enjoy please talk to your healthcare provider.  Eye exam- Visit your eye doctor every year.  Safe sex- If you may be exposed to a sexually transmitted infection, use a condom.  Seat belts- Seat belts can save your life; always wear one.  Smoke/Carbon Monoxide detectors- These detectors need to be installed on the appropriate level of your home.  Replace batteries at least once a year.  Skin cancer- When out in the sun, cover up and use sunscreen 15 SPF or higher.  Violence- If anyone is threatening you, please tell your healthcare provider.  Living Will/ Health care  power of attorney- Speak with your healthcare provider and family.  If you have lab work done today you will be contacted with your lab results within the next 2 weeks.  If you have not heard from [place] then please contact us. The fastest way to get your results is to register for My Chart.   IF you received an x-ray today, you will receive an invoice from [facility]. Please contact [facility] at [phone] with questions or concerns regarding your invoice.   IF you received labwork today, you will receive an invoice from [facility]. Please contact [facility] at [phone] with questions or concerns regarding your invoice.   Our billing staff will not be able to assist you with questions regarding bills from these companies.  You will be contacted with the lab results as soon as they are available. The fastest way to get your results is to activate your My Chart account. Instructions are located on the last page of this paperwork. If you have not heard from [place] regarding the results in 2 weeks, please contact this office.

## 2018-05-09 NOTE — Progress Notes (Signed)
Subjective:  By signing my name below, I, Essence Howell, attest that this documentation has been prepared under the direction and in the presence of Wendie Agreste, MD Electronically Signed: Ladene Artist, ED Scribe 05/09/2018 at 9:08 AM.   Patient ID: Jeffery Clements, male    DOB: April 17, 1956, 62 y.o.   MRN: [id number]  Chief Complaint  Patient presents with  . Annual Exam    CPE   HPI Jeffery Clements is a 62 y.o. male who presents to Primary Care at [facility] for an annual exam. H/o hypothyroidism, mild hyperlipidemia, hearing loss.  Hypothyroidism Synthroid 75 mcg qd.  CA Screening Colonoscopy: 08/03/17, rpt 7 yrs Prostate CA Screening: PSA on 04/26/16. Followed by urology Dr. Pilar Jarvis, appointment 04/19/18 with f/u in 1 yr. H/o BPH. PSA 0.63 in 02/2017. Lab Results  Component Value Date   PSA 0.5 04/26/2016   PSA 1.46 04/14/2015   PSA 2.09 04/01/2015   Immunizations Immunization History  Administered Date(s) Administered  . Influenza,inj,Quad PF,6+ Mos 05/09/2018  . Influenza-Unspecified 05/21/2014  . Tdap 02/15/2012  . Zoster 03/22/2014  Shingrix: sent to pharmacy  Depression Screening Depression screen [facility] 2/9 05/09/2018 08/21/2017 05/04/2017 05/04/2017 04/28/2016  Decreased Interest 0 0 0 0 0  Down, Depressed, Hopeless 0 0 0 0 0  PHQ - 2 Score 0 0 0 0 0     Visual Acuity Screening   Right eye Left eye Both eyes  Without correction:     With correction: 20/15-2 20/20-2 20/15-1   Vision: wears glasses Dentist: last seen 2-3 months ago Exercise: no regular exercise  R Shoulder Pain Pt reports intermittent R shoulder pain only with lifting x 1 month. Pain doesn't keep him from doing daily activities. Denies fall/injury. No meds tried PTA.  Patient Active Problem List   Diagnosis Date Noted  . Cough 08/21/2017  . Lower respiratory infection 08/21/2017  . Dyspnea on exertion 05/20/2016  . Hematuria 04/14/2015  . Hypertriglyceridemia 09/11/2012  . Rectal fissure  09/11/2012  . Colon polyps 09/11/2012   No past medical history on file. Past Surgical History:  Procedure Laterality Date  . VASECTOMY     No Known Allergies Prior to Admission medications   Medication Sig Start Date End Date Taking? Authorizing Provider  azithromycin (ZITHROMAX) 250 MG tablet Sig as indicated 08/21/17   Horald Pollen, MD  benzonatate (TESSALON) 200 MG capsule Take 1 capsule (200 mg total) by mouth 2 (two) times daily as needed for cough. 08/21/17   Horald Pollen, MD  clobetasol cream (TEMOVATE) 0.81 % Apply 1 application topically 2 (two) times daily. 04/07/14   Darlyne Russian, MD  levothyroxine (SYNTHROID, LEVOTHROID) 75 MCG tablet TAKE ONE TABLET BY MOUTH ONCE DAILY BEFORE  BREAKFAST 05/04/17   Wendie Agreste, MD  Multiple Vitamins-Minerals (CENTRUM SILVER ADULT 50+) TABS Take by mouth.    [provider]  promethazine-codeine (PHENERGAN WITH CODEINE) 6.25-10 MG/5ML syrup Take 5 mLs by mouth at bedtime as needed for cough. 08/21/17   Horald Pollen, MD  Triamcinolone Acetonide (TRIAMCINOLONE 0.1 % CREAM : EUCERIN) CREA Apply 1 application topically 3 (three) times daily. 07/10/14   Darlyne Russian, MD   Social History   Socioeconomic History  . Marital status: Divorced    Spouse name: Not on file  . Number of children: Not on file  . Years of education: Not on file  . Highest education level: Not on file  Occupational History  . Occupation: Optometrist  Employer: [employer]  Social Needs  . Financial resource strain: Not on file  . Food insecurity:    Worry: Not on file    Inability: Not on file  . Transportation needs:    Medical: Not on file    Non-medical: Not on file  Tobacco Use  . Smoking status: Never Smoker  . Smokeless tobacco: Never Used  Substance and Sexual Activity  . Alcohol use: No  . Drug use: No  . Sexual activity: Not on file  Lifestyle  . Physical activity:    Days per week: Not on file     Minutes per session: Not on file  . Stress: Not on file  Relationships  . Social connections:    Talks on phone: Not on file    Gets together: Not on file    Attends religious service: Not on file    Active member of club or organization: Not on file    Attends meetings of clubs or organizations: Not on file    Relationship status: Not on file  . Intimate partner violence:    Fear of current or ex partner: Not on file    Emotionally abused: Not on file    Physically abused: Not on file    Forced sexual activity: Not on file  Other Topics Concern  . Not on file  Social History Narrative   Divorced. Education: [employer]. Exercise: [employer] 3 times a week for 30 minutes.   Review of Systems  HENT: Positive for hearing loss.   Musculoskeletal: Positive for arthralgias.      Objective:   Physical Exam  Constitutional: He is oriented to person, place, and time. He appears well-developed and well-nourished.  HENT:  Head: Normocephalic and atraumatic.  Right Ear: External ear normal.  Left Ear: External ear normal.  Mouth/Throat: Oropharynx is clear and moist.  Eyes: Pupils are equal, round, and reactive to light. Conjunctivae and EOM are normal.  Neck: Normal range of motion. Neck supple. No thyroid mass and no thyromegaly present.  C-spine: Pain free ROM. Slight decreased extension.  Cardiovascular: Normal rate, regular rhythm, normal heart sounds and intact distal pulses.  Pulmonary/Chest: Effort normal and breath sounds normal. No respiratory distress. He has no wheezes.  Abdominal: Soft. He exhibits no distension. There is no tenderness.  Musculoskeletal: Normal range of motion. He exhibits no edema or tenderness.  R shoulder: No , AC, clavicle tenderness. FROM. Neg Neer. Slight discomfort with Hawkins. Slight tenderness along mid humerus. Bicipital groove is nontender. Deltoid is nontender.  Lymphadenopathy:    He has no cervical adenopathy.  Neurological: He is alert and  oriented to person, place, and time. He has normal reflexes.  Skin: Skin is warm and dry.  Psychiatric: He has a normal mood and affect. His behavior is normal.  Vitals reviewed.  Vitals:   05/09/18 0856  BP: 115/77  Pulse: 60  Temp: 98.5 F (36.9 C)  TempSrc: Oral  SpO2: 97%  Weight: 184 lb 6.4 oz (83.6 kg)  Height: 5\' 8"  (1.727 m)      Assessment & Plan:   Attikus Bartoszek is a 62 y.o. male Annual physical exam  -anticipatory guidance as below in AVS, screening labs above. Health maintenance items as above in HPI discussed/recommended as applicable.   Need for influenza vaccination - Plan: Flu Vaccine QUAD 36+ mos IM  Pain in right upper arm - Plan: DG Humerus Right, CANCELED: DG Humerus Right  -Reassuring exam, shoulder without apparent concerns.  Positive Hawkins but primarily pain was in upper arm as opposed to shoulder.  Biceps and triceps testing was reassuring.  Check x-ray, Tylenol intermittently if needed and recheck in the next 6 weeks if not improved.  Hypothyroidism, unspecified type - Plan: TSH, levothyroxine (SYNTHROID, LEVOTHROID) 75 MCG tablet  -Tolerating current regimen, check TSH, refilled Synthroid same dose  Hyperlipidemia, unspecified hyperlipidemia type - Plan: Comprehensive metabolic panel, Lipid panel  -Slight elevation previously.  Stressed importance of increased exercise, check labs to determine ASCVD risk and statin recommendations  Need for shingles vaccine - Plan: Zoster Vaccine Adjuvanted [facility]) injection sent to pharmacy   Meds ordered this encounter  Medications  . Zoster Vaccine Adjuvanted [facility]) injection    Sig: Inject 0.5 mLs into the muscle once for 1 dose. Repeat in 2-6 months.    Dispense:  0.5 mL    Refill:  1  . levothyroxine (SYNTHROID, LEVOTHROID) 75 MCG tablet    Sig: TAKE ONE TABLET BY MOUTH ONCE DAILY BEFORE  BREAKFAST    Dispense:  90 tablet    Refill:  3   Patient Instructions   New shingles vaccine sent to  your pharmacy.   Exercise most days of the week, low intensity. Goal of 150 minutes per week.   No change in med doses for now.   Tylenol if needed for arm pain intermittently, but recheck in next 6 weeks if not improving. Please have xray performed at [facility] imaging:  [address]. [phone]   Return to the clinic or go to the nearest emergency room if any of your symptoms worsen or new symptoms occur.  Keeping you healthy  Get these tests  Blood pressure- Have your blood pressure checked once a year by your healthcare provider.  Normal blood pressure is 120/80  Weight- Have your body mass index (BMI) calculated to screen for obesity.  BMI is a measure of body fat based on height and weight. You can also calculate your own BMI at ViewBanking.[email or web address].  Cholesterol- Have your cholesterol checked every year.  Diabetes- Have your blood sugar checked regularly if you have high blood pressure, high cholesterol, have a family history of diabetes or if you are overweight.  Screening for Colon Cancer- Colonoscopy starting at age 1.  Screening may begin sooner depending on your family history and other health conditions. Follow up colonoscopy as directed by your Gastroenterologist.  Screening for Prostate Cancer- Both blood work (PSA) and a rectal exam help screen for Prostate Cancer.  Screening begins at age 27 with African-American men and at age 37 with Caucasian men.  Screening may begin sooner depending on your family history.  Take these medicines  Aspirin- One aspirin daily can help prevent Heart disease and Stroke.  Flu shot- Every fall.  Tetanus- Every 10 years.  Zostavax- Once after the age of 75 to prevent Shingles.  Pneumonia shot- Once after the age of 56; if you are younger than 7, ask your healthcare provider if you need a Pneumonia shot.  Take these steps  Don't smoke- If you do smoke, talk to your doctor about quitting.  For  tips on how to quit, go to www.smokefree.gov or call 1-800-QUIT-NOW.  Be physically active- Exercise 5 days a week for at least 30 minutes.  If you are not already physically active start slow and gradually work up to 30 minutes of moderate physical activity.  Examples of moderate activity include walking briskly, mowing the yard, dancing, swimming, bicycling, etc.  Eat a healthy diet- Eat a variety of healthy food such as fruits, vegetables, low fat milk, low fat cheese, yogurt, lean meant, poultry, fish, beans, tofu, etc. For more information go to www.thenutritionsource.org  Drink alcohol in moderation- Limit alcohol intake to less than two drinks a day. Never drink and drive.  Dentist- Brush and floss twice daily; visit your dentist twice a year.  Depression- Your emotional health is as important as your physical health. If you're feeling down, or losing interest in things you would normally enjoy please talk to your healthcare provider.  Eye exam- Visit your eye doctor every year.  Safe sex- If you may be exposed to a sexually transmitted infection, use a condom.  Seat belts- Seat belts can save your life; always wear one.  Smoke/Carbon Monoxide detectors- These detectors need to be installed on the appropriate level of your home.  Replace batteries at least once a year.  Skin cancer- When out in the sun, cover up and use sunscreen 15 SPF or higher.  Violence- If anyone is threatening you, please tell your healthcare provider.  Living Will/ Health care power of attorney- Speak with your healthcare provider and family.  If you have lab work done today you will be contacted with your lab results within the next 2 weeks.  If you have not heard from [place] then please contact us. The fastest way to get your results is to register for My Chart.   IF you received an x-ray today, you will receive an invoice from [facility]. Please contact [facility] at [phone] with  questions or concerns regarding your invoice.   IF you received labwork today, you will receive an invoice from [place]. Please contact [facility] at [phone] with questions or concerns regarding your invoice.   Our billing staff will not be able to assist you with questions regarding bills from these companies.  You will be contacted with the lab results as soon as they are available. The fastest way to get your results is to activate your My Chart account. Instructions are located on the last page of this paperwork. If you have not heard from [place] regarding the results in 2 weeks, please contact this office.       I personally performed the services described in this documentation, which was scribed in my presence. The recorded information has been reviewed and considered for accuracy and completeness, addended by me as needed, and agree with information above.  Signed,   [name], MD Primary Care at [place].  05/09/18 9:36 AM

## 2018-05-10 LAB — COMPREHENSIVE METABOLIC PANEL
ALBUMIN: 4.6 g/dL (ref 3.6–4.8)
ALK PHOS: 94 IU/L (ref 39–117)
ALT: 28 IU/L (ref 0–44)
AST: 23 IU/L (ref 0–40)
Albumin/Globulin Ratio: 1.8 (ref 1.2–2.2)
BUN / CREAT RATIO: 15 (ref 10–24)
BUN: 15 mg/dL (ref 8–27)
Bilirubin Total: 1.1 mg/dL (ref 0.0–1.2)
CO2: 23 mmol/L (ref 20–29)
CREATININE: 0.97 mg/dL (ref 0.76–1.27)
Calcium: 9.6 mg/dL (ref 8.6–10.2)
Chloride: 101 mmol/L (ref 96–106)
GFR, EST AFRICAN AMERICAN: 96 mL/min/{1.73_m2} (ref >59)
GFR, EST NON AFRICAN AMERICAN: 83 mL/min/{1.73_m2} (ref >59)
GLUCOSE: 92 mg/dL (ref 65–99)
Globulin, Total: 2.5 g/dL (ref 1.5–4.5)
Potassium: 4.4 mmol/L (ref 3.5–5.2)
Sodium: 140 mmol/L (ref 134–144)
TOTAL PROTEIN: 7.1 g/dL (ref 6.0–8.5)

## 2018-05-10 LAB — LIPID PANEL
CHOL/HDL RATIO: 3.9 ratio (ref 0.0–5.0)
CHOLESTEROL TOTAL: 214 mg/dL — AB (ref 100–199)
HDL: 55 mg/dL (ref >39)
LDL CALC: 132 mg/dL — AB (ref 0–99)
Triglycerides: 136 mg/dL (ref 0–149)
VLDL CHOLESTEROL CAL: 27 mg/dL (ref 5–40)

## 2018-05-10 LAB — TSH: TSH: 3.88 u[IU]/mL (ref 0.450–4.500)

## 2018-07-09 ENCOUNTER — Encounter: Payer: Self-pay | Admitting: Family Medicine

## 2018-07-31 ENCOUNTER — Ambulatory Visit: Payer: Managed Care, Other (non HMO) | Admitting: Family Medicine

## 2018-07-31 ENCOUNTER — Other Ambulatory Visit: Payer: Self-pay

## 2018-07-31 ENCOUNTER — Encounter: Payer: Self-pay | Admitting: Family Medicine

## 2018-07-31 VITALS — BP 133/81 | HR 66 | Temp 98.4°F | Ht 68.0 in | Wt 186.0 lb

## 2018-07-31 DIAGNOSIS — M549 Dorsalgia, unspecified: Secondary | ICD-10-CM

## 2018-07-31 DIAGNOSIS — R202 Paresthesia of skin: Secondary | ICD-10-CM

## 2018-07-31 MED ORDER — DICLOFENAC SODIUM 75 MG PO TBEC: 75.0000 mg | DELAYED_RELEASE_TABLET | Freq: Two times a day (BID) | ORAL | 0 refills | Status: DC

## 2018-07-31 MED ORDER — CYCLOBENZAPRINE HCL 10 MG PO TABS: 10.0000 mg | ORAL_TABLET | Freq: Three times a day (TID) | ORAL | 0 refills | Status: DC | PRN

## 2018-07-31 NOTE — Patient Instructions (Signed)
° ° ° °  If you have lab work done today you will be contacted with your lab results within the next 2 weeks.  If you have not heard from us then please contact us. The fastest way to get your results is to register for My Chart. ° ° °IF you received an x-ray today, you will receive an invoice from [facility]. Please contact [facility] at [phone] with questions or concerns regarding your invoice.  ° °IF you received labwork today, you will receive an invoice from [facility]. Please contact [facility] at [phone] with questions or concerns regarding your invoice.  ° °Our billing staff will not be able to assist you with questions regarding bills from these companies. ° °You will be contacted with the lab results as soon as they are available. The fastest way to get your results is to activate your My Chart account. Instructions are located on the last page of this paperwork. If you have not heard from us regarding the results in 2 weeks, please contact this office. °  ° ° ° °

## 2018-07-31 NOTE — Progress Notes (Signed)
   12/10/20196:09 PM  Jeffery Clements 12/01/1955, 62 y.o. male [id number]  Chief Complaint  Patient presents with  . Pain    having pain in the  left shoulder, elbow and tingling in the left hand. Taking tylenol and using hot patch with no resolve    HPI:   Patient is a 62 y.o. male with past medical history significant for hypothyroidism who presents today for left arm pain with numbness and tingling  10 days ago started having pain at shoulder blade area Now traveling down shoulder, sharp elbow pain Tingling goes down to hand Not dropping objects Neck does not hurt Denies any inciting event Has had similar event last year Right handed  Fall Risk  07/31/2018 05/09/2018 08/21/2017 05/04/2017 05/04/2017  Falls in the past year? 0 No No No No     Depression screen [facility] 2/9 07/31/2018 05/09/2018 08/21/2017  Decreased Interest 0 0 0  Down, Depressed, Hopeless 0 0 0  PHQ - 2 Score 0 0 0    No Known Allergies  Prior to Admission medications   Medication Sig Start Date End Date Taking? Authorizing Provider  clobetasol cream (TEMOVATE) 3.74 % Apply 1 application topically 2 (two) times daily. 04/07/14  Yes Daub, Loura Back, MD  levothyroxine (SYNTHROID, LEVOTHROID) 75 MCG tablet TAKE ONE TABLET BY MOUTH ONCE DAILY BEFORE  BREAKFAST 05/09/18  Yes Wendie Agreste, MD  Multiple Vitamins-Minerals (CENTRUM SILVER ADULT 50+) TABS Take by mouth.   Yes [provider]  Triamcinolone Acetonide (TRIAMCINOLONE 0.1 % CREAM : EUCERIN) CREA Apply 1 application topically 3 (three) times daily. 07/10/14  Yes Darlyne Russian, MD    No past medical history on file.  Past Surgical History:  Procedure Laterality Date  . VASECTOMY      Social History   Tobacco Use  . Smoking status: Never Smoker  . Smokeless tobacco: Never Used  Substance Use Topics  . Alcohol use: No    Family History  Problem Relation Age of Onset  . Lung disease Mother     ROS Per hpi  OBJECTIVE:  Blood  pressure 133/81, pulse 66, temperature 98.4 F (36.9 C), temperature source Oral, height 5\' 8"  (1.727 m), weight 186 lb (84.4 kg), SpO2 94 %. Body mass index is 28.28 kg/m.   Physical Exam  Gen: AAOx3, NAD Neck: limited left flexion, no bony or paraspinal tenderness Back: no spine TTP, mild TTP along medial scapular border LUE: shoulder FROM, nontender, elbow FROM, mild tender lateral epicondyle, neg tinel and elbow flexion test Normal strength, sensation, reflexes   ASSESSMENT and PLAN  1. Upper back pain on left side 2. Arm paresthesia, left Discussed supportive measures, new meds r/se/b and RTC precautions  Other orders - diclofenac (VOLTAREN) 75 MG EC tablet; Take 1 tablet (75 mg total) by mouth 2 (two) times daily. - cyclobenzaprine (FLEXERIL) 10 MG tablet; Take 1 tablet (10 mg total) by mouth 3 (three) times daily as needed for muscle spasms.  Return if symptoms worsen or fail to improve.    Rutherford Guys, MD Primary Care at [place], [place] [address] Ph.  [phone] Fax [phone]

## 2019-02-15 ENCOUNTER — Ambulatory Visit: Payer: Self-pay

## 2019-02-15 NOTE — Telephone Encounter (Signed)
Pt called state that his office was told to go home and quarantine for 14 days. They had a co worker who works off campus test positive for COVID-19 Per Mr Fudala he was never in contact with this worker except for maybe using the same door knob or restroom. He states that he does have a sore throat. No other symptoms. Care advice read to patient. Patient verbalized understanding of all instructions. Call transferred to office for scheduling.  Reason for Disposition . [1] COVID-19 EXPOSURE (Close Contact) AND [2] within last 14 days BUT [3] NO symptoms  Answer Assessment - Initial Assessment Questions 1. CLOSE CONTACT: "Who is the person with the confirmed or suspected COVID-19 infection that you were exposed to?"     Co worker positive works off site 2. PLACE of CONTACT: "Where were you when you were exposed to COVID-19?" (e.g., home, school, medical waiting room; which city?)     work 3. TYPE of CONTACT: "How much contact was there?" (e.g., sitting next to, live in same house, work in same office, same building)     Same building 4. DURATION of CONTACT: "How long were you in contact with the COVID-19 patient?" (e.g., a few seconds, passed by person, a few minutes, live with the patient)    Didn't see the person but in the same building 5. DATE of CONTACT: "When did you have contact with a COVID-19 patient?" (e.g., how many days ago)   23rd 6. TRAVEL: "Have you traveled out of the country recently?" If so, "When and where?"     * Also ask about out-of-state travel, since the CDC has identified some high-risk cities for community spread in the [place].     * Note: Travel becomes less relevant if there is widespread community transmission where the patient lives.    No 7. COMMUNITY SPREAD: "Are there lots of cases of COVID-19 (community [place] 8. SYMPTOMS: "Do you have any symptoms?" (e.g., fever, cough, breathing difficulty)    Sore throat 9. PREGNANCY OR POSTPARTUM: "Is there any chance you  are pregnant?" "When was your last menstrual period?" "Did you deliver in the last 2 weeks?"     No10. HIGH RISK: "Do you have any heart or lung problems? Do you have a weak immune system?" (e.g., CHF, COPD, asthma, HIV positive, chemotherapy, renal failure, diabetes mellitus, sickle cell anemia)       no  Protocols used: CORONAVIRUS (COVID-19) EXPOSURE-A-AH

## 2019-02-18 ENCOUNTER — Telehealth (INDEPENDENT_AMBULATORY_CARE_PROVIDER_SITE_OTHER): Payer: Managed Care, Other (non HMO) | Admitting: Family Medicine

## 2019-02-18 VITALS — Ht 68.0 in | Wt 180.0 lb

## 2019-02-18 DIAGNOSIS — Z20822 Contact with and (suspected) exposure to covid-19: Secondary | ICD-10-CM

## 2019-02-18 DIAGNOSIS — Z20828 Contact with and (suspected) exposure to other viral communicable diseases: Secondary | ICD-10-CM

## 2019-02-18 DIAGNOSIS — J029 Acute pharyngitis, unspecified: Secondary | ICD-10-CM | POA: Diagnosis not present

## 2019-02-18 NOTE — Patient Instructions (Signed)
I will arrange testing for Covid 19.  Fluids, rest and tylenol for now if needed for sore throat.   Return to the clinic or go to the nearest emergency room if any of your symptoms worsen or new symptoms occur.  This information is directly available on the CDC website: RunningShows.co.za.html    Source:CDC Reference to specific commercial products, manufacturers, companies, or trademarks does not constitute its endorsement or recommendation by the [place], [place], or Centers for [employer] and Prevention.

## 2019-02-18 NOTE — Progress Notes (Signed)
Virtual Visit via Telephone Note  I connected with Jeffery Clements on 02/18/19 at 5:16 PM by telephone and verified that I am speaking with the correct person using two identifiers.   I discussed the limitations, risks, security and privacy concerns of performing an evaluation and management service by telephone and the availability of in person appointments. I also discussed with the patient that there may be a patient responsible charge related to this service. The patient expressed understanding and agreed to proceed, consent obtained  Chief complaint: Sore throat, possible exposure to Covid.  History of Present Illness: Jeffery Clements is a 63 y.o. male   One employee at his work tested positive for [phone].  Works at [employer].   central office. Another employee who works off campus (but was on campus on 6/23) tested positive for Covid 19.  He does wear mask most of the time, not when in his office at times. Unknown direct contact with person, but could have shared same bathroom.  Was advised to self quarantine for 2 weeks, and building will be closed for 2 weeks, advised to get tested as well.   Felt ok until this morning.  Started with sore throat this morning, minimal mucus in throat.  No known fever, but thermometer may not be reliable. No subjective fever/chills.  No cough/dyspnea with exertion.  No change in taste/smell.  No N/V/D.  No bodyaches.     Patient Active Problem List   Diagnosis Date Noted  . Cough 08/21/2017  . Lower respiratory infection 08/21/2017  . Dyspnea on exertion 05/20/2016  . Sensorineural hearing loss (SNHL) 05/17/2016  . Subjective tinnitus of both ears 05/17/2016  . Hematuria 04/14/2015  . Hypertriglyceridemia 09/11/2012  . Rectal fissure 09/11/2012  . Colon polyps 09/11/2012   No past medical history on file. Past Surgical History:  Procedure Laterality Date  . VASECTOMY     No Known Allergies Prior to Admission medications    Medication Sig Start Date End Date Taking? Authorizing Provider  clobetasol cream (TEMOVATE) 3.66 % Apply 1 application topically 2 (two) times daily. 04/07/14  Yes Daub, Loura Back, MD  levothyroxine (SYNTHROID, LEVOTHROID) 75 MCG tablet TAKE ONE TABLET BY MOUTH ONCE DAILY BEFORE  BREAKFAST 05/09/18  Yes Wendie Agreste, MD  Multiple Vitamins-Minerals (CENTRUM SILVER ADULT 50+) TABS Take by mouth.   Yes [provider]  Triamcinolone Acetonide (TRIAMCINOLONE 0.1 % CREAM : EUCERIN) CREA Apply 1 application topically 3 (three) times daily. 07/10/14  Yes Darlyne Russian, MD  cyclobenzaprine (FLEXERIL) 10 MG tablet Take 1 tablet (10 mg total) by mouth 3 (three) times daily as needed for muscle spasms. Patient not taking: Reported on 02/18/2019 07/31/18   Rutherford Guys, MD  diclofenac (VOLTAREN) 75 MG EC tablet Take 1 tablet (75 mg total) by mouth 2 (two) times daily. Patient not taking: Reported on 02/18/2019 07/31/18   Rutherford Guys, MD   Social History   Socioeconomic History  . Marital status: Divorced    Spouse name: Not on file  . Number of children: Not on file  . Years of education: Not on file  . Highest education level: Not on file  Occupational History  . Occupation: Aeronautical engineer: [employer]  . Financial resource strain: Not on file  . Food insecurity    Worry: Not on file    Inability: Not on file  . Transportation needs    Medical: Not on file  Non-medical: Not on file  Tobacco Use  . Smoking status: Never Smoker  . Smokeless tobacco: Never Used  Substance and Sexual Activity  . Alcohol use: No  . Drug use: No  . Sexual activity: Not on file  Lifestyle  . Physical activity    Days per week: Not on file    Minutes per session: Not on file  . Stress: Not on file  Relationships  . Social Herbalist on phone: Not on file    Gets together: Not on file    Attends religious service: Not on file    Active member  of club or organization: Not on file    Attends meetings of clubs or organizations: Not on file    Relationship status: Not on file  . Intimate partner violence    Fear of current or ex partner: Not on file    Emotionally abused: Not on file    Physically abused: Not on file    Forced sexual activity: Not on file  Other Topics Concern  . Not on file  Social History Narrative   Divorced. Education: [employer]. Exercise: [employer] 3 times a week for 30 minutes.     Observations/Objective: Speaking in full sentences, no distress on phone.  Appropriate responses. Home vital signs: Vitals:   02/18/19 1541  Weight: 180 lb (81.6 kg)  Height: 5\' 8"  (1.727 m)     Assessment and Plan: Sore throat - Plan: Exposure to Covid-19 Virus - Plan:   -Possible exposure to coronavirus positive person at work.  Now with sore throat but no other concerning symptoms.  Currently on self isolation.    -Check COVID testing, continue isolation, handout given from [facility] and RTC/follow-up precautions by telemedicine if worsening symptoms.  Symptomatic care was discussed.   Follow Up Instructions: Patient Instructions  I will arrange testing for Covid 19.  Fluids, rest and tylenol for now if needed for sore throat.   Return to the clinic or go to the nearest emergency room if any of your symptoms worsen or new symptoms occur.  This information is directly available on the CDC website: RunningShows.co.za.html    Source:CDC Reference to specific commercial products, manufacturers, companies, or trademarks does not constitute its endorsement or recommendation by the [place], [place], or Centers for [employer] and Prevention.      I discussed the assessment and treatment plan with the patient. The patient was provided an opportunity to ask questions and all were answered. The patient agreed with the plan and  demonstrated an understanding of the instructions.   The patient was advised to call back or seek an in-person evaluation if the symptoms worsen or if the condition fails to improve as anticipated.  I provided 15 minutes of non-face-to-face time during this encounter.  Signed,   [name], MD [facility].  02/18/19

## 2019-02-18 NOTE — Progress Notes (Signed)
Chief Complaint: Pt states that someone at his work place tested positive on Friday 02/15/19. Pt state that X today sore throat.  Unable to check  temp

## 2019-02-21 ENCOUNTER — Encounter: Payer: Self-pay | Admitting: Family Medicine

## 2019-02-22 ENCOUNTER — Telehealth: Payer: Self-pay

## 2019-02-22 DIAGNOSIS — Z20822 Contact with and (suspected) exposure to covid-19: Secondary | ICD-10-CM

## 2019-02-22 NOTE — Telephone Encounter (Signed)
-----   Message from Wendie Agreste, MD sent at 02/21/2019  9:39 PM EDT ----- Eval on 6/29. Sore throat, possible work exposure. not sure if staff sent a message for testing at that time, but please schedule for testing. Let me know if questions. Lavone Nian   [employer] [id number], ID# [id number]

## 2019-02-22 NOTE — Telephone Encounter (Signed)
Phone call to pt.  Scheduled for COVID testing at 12:00 PM, 7/6 at [facility] site.  Advised to wear a mask and remain in the car for testing.  Verb. Understanding.

## 2019-02-25 ENCOUNTER — Other Ambulatory Visit: Payer: Managed Care, Other (non HMO)

## 2019-02-25 DIAGNOSIS — Z20822 Contact with and (suspected) exposure to covid-19: Secondary | ICD-10-CM

## 2019-03-02 LAB — NOVEL CORONAVIRUS, NAA: SARS-CoV-2, NAA: NOT DETECTED

## 2019-03-23 IMAGING — CR DG HUMERUS 2V *R*
2 series · 2 of 2 positions shown · non-contrast
Comparison: 07/10/2014

CLINICAL DATA: Mid right humeral pain.  No known injury.

EXAM:
RIGHT HUMERUS - 2+ VIEW

[w humerus ap right]
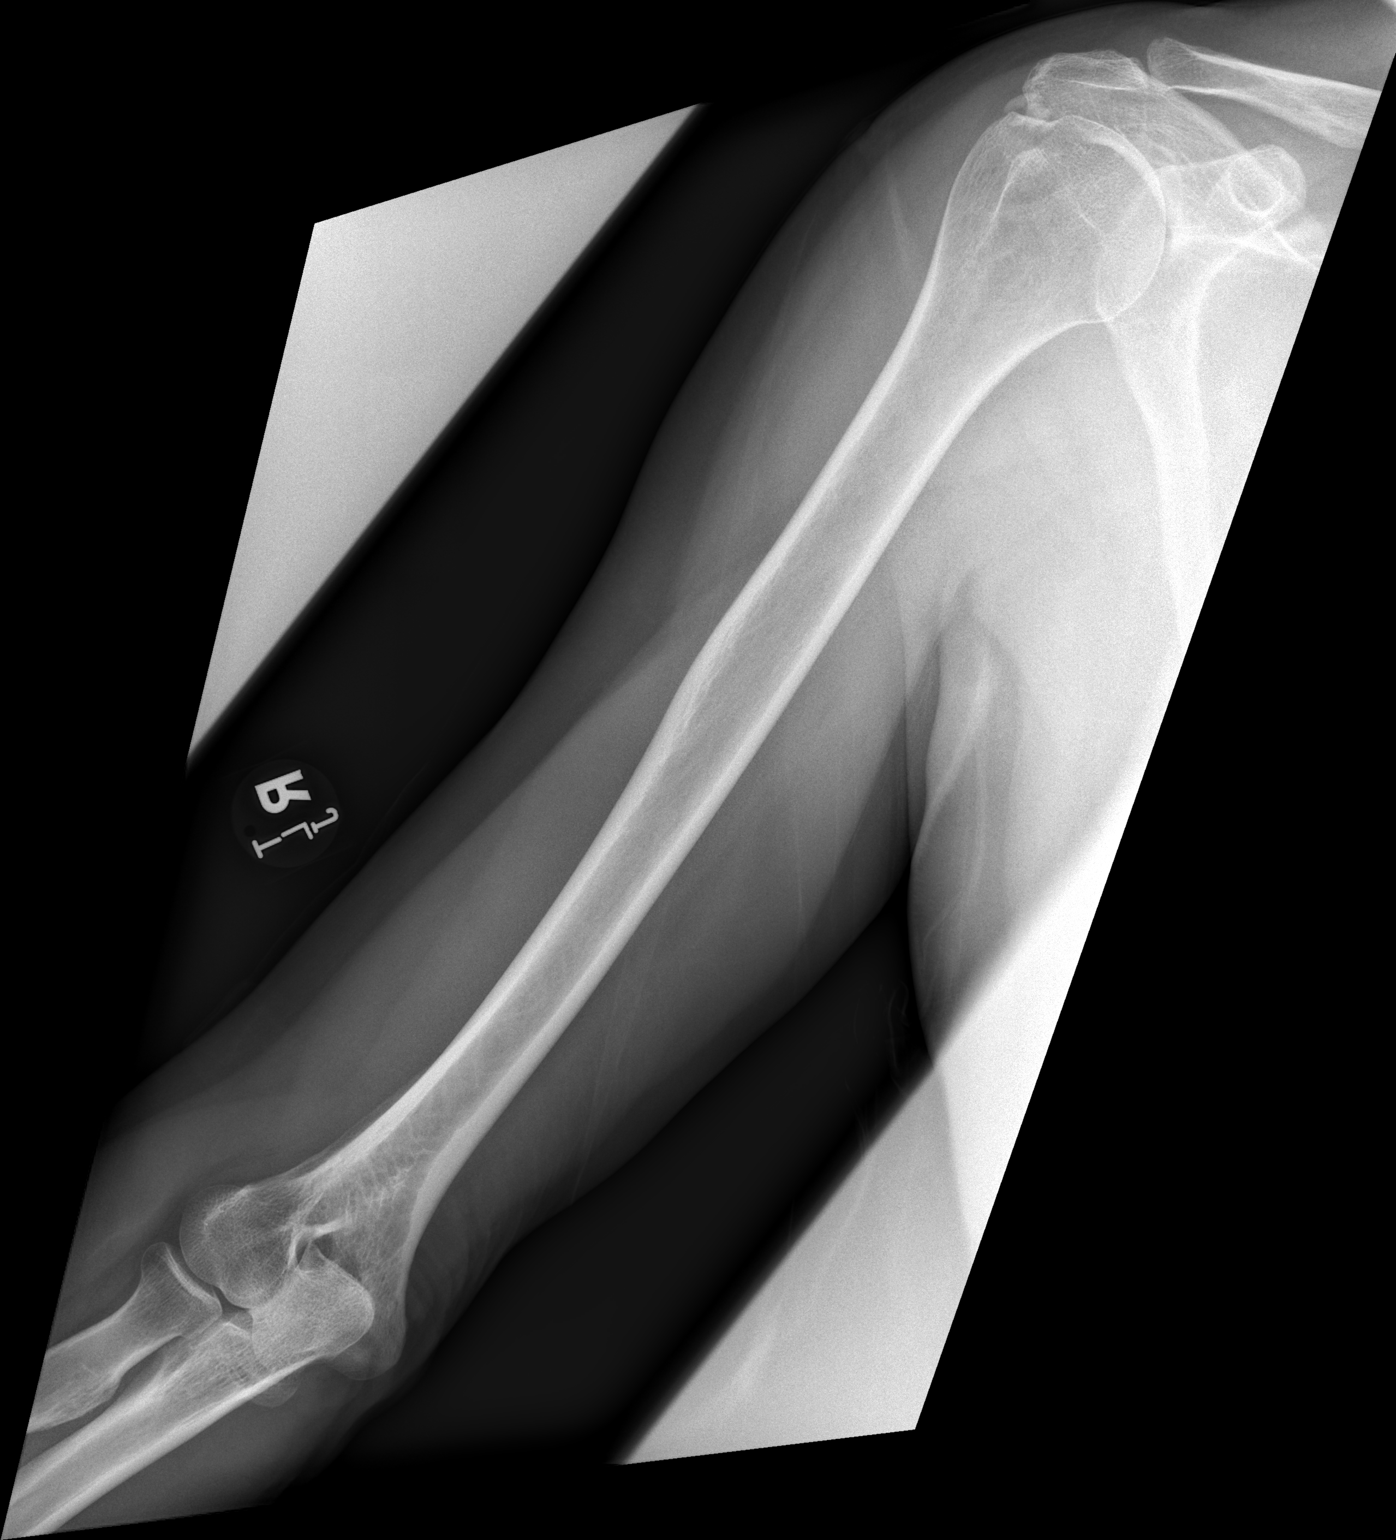

[w humerus lat right]
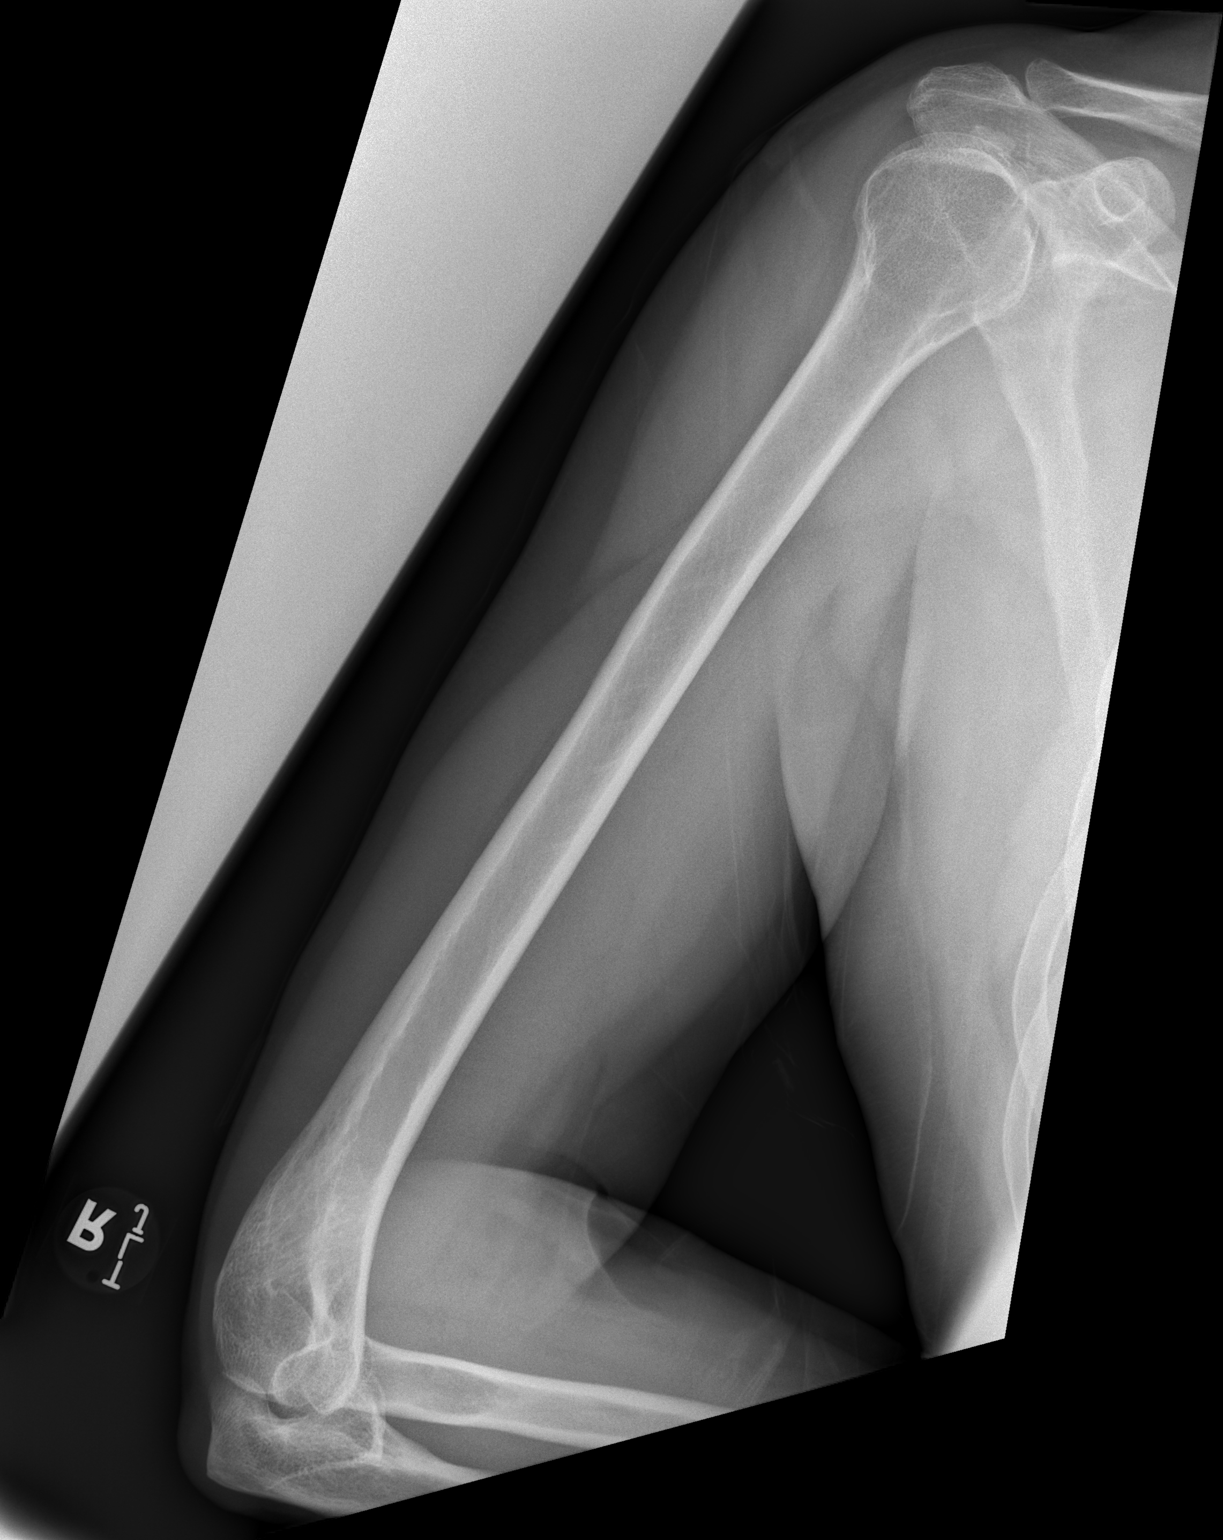

[2 of 2 positions shown; findings below may reference images not displayed]

FINDINGS: Calcification at the rotator cuff insertion along the greater
tuberosity. Mild degenerative changes in the right AC joint. No
focal bony abnormality. No fracture, subluxation or dislocation.
IMPRESSION: Degenerative changes in the right AC joint. Chronic calcific
tendinitis changes of the rotator cuff. No acute bony abnormality.

## 2019-05-13 ENCOUNTER — Other Ambulatory Visit: Payer: Self-pay

## 2019-05-13 ENCOUNTER — Telehealth: Payer: Self-pay | Admitting: Family Medicine

## 2019-05-13 DIAGNOSIS — E039 Hypothyroidism, unspecified: Secondary | ICD-10-CM

## 2019-05-13 MED ORDER — LEVOTHYROXINE SODIUM 75 MCG PO TABS: ORAL_TABLET | ORAL | 0 refills | Status: DC

## 2019-05-13 NOTE — Telephone Encounter (Signed)
Rx sent to pharmacy   

## 2019-05-13 NOTE — Telephone Encounter (Signed)
Patient needs refill on levothyroxine (SYNTHROID, LEVOTHROID) 75 MCG tablet  Has 2 days left of med and has scheduled his annual exam for October.

## 2019-05-30 ENCOUNTER — Ambulatory Visit (INDEPENDENT_AMBULATORY_CARE_PROVIDER_SITE_OTHER): Payer: Managed Care, Other (non HMO) | Admitting: Family Medicine

## 2019-05-30 ENCOUNTER — Other Ambulatory Visit: Payer: Self-pay

## 2019-05-30 ENCOUNTER — Encounter: Payer: Self-pay | Admitting: Family Medicine

## 2019-05-30 VITALS — BP 109/72 | HR 77 | Temp 98.4°F | Wt 188.2 lb

## 2019-05-30 DIAGNOSIS — E039 Hypothyroidism, unspecified: Secondary | ICD-10-CM | POA: Diagnosis not present

## 2019-05-30 DIAGNOSIS — Z125 Encounter for screening for malignant neoplasm of prostate: Secondary | ICD-10-CM

## 2019-05-30 DIAGNOSIS — Z0000 Encounter for general adult medical examination without abnormal findings: Secondary | ICD-10-CM

## 2019-05-30 DIAGNOSIS — Z23 Encounter for immunization: Secondary | ICD-10-CM

## 2019-05-30 DIAGNOSIS — E785 Hyperlipidemia, unspecified: Secondary | ICD-10-CM

## 2019-05-30 DIAGNOSIS — G4700 Insomnia, unspecified: Secondary | ICD-10-CM

## 2019-05-30 DIAGNOSIS — N400 Enlarged prostate without lower urinary tract symptoms: Secondary | ICD-10-CM

## 2019-05-30 DIAGNOSIS — Z0001 Encounter for general adult medical examination with abnormal findings: Secondary | ICD-10-CM | POA: Diagnosis not present

## 2019-05-30 MED ORDER — SHINGRIX 50 MCG/0.5ML IM SUSR: 0.5000 mL | Freq: Once | INTRAMUSCULAR | 1 refills | Status: AC

## 2019-05-30 NOTE — Patient Instructions (Addendum)
Try melatonin and other info for sleep below. Restart low intensity exercise most days per week as a goal.  and other stress management discussed below. Recheck in 6 weeks.  Return to the clinic or go to the nearest emergency room if any of your symptoms worsen or new symptoms occur.  Call dermatology, urology, ENT, and eye provider for appointment.   Keeping you healthy  Get these tests  Blood pressure- Have your blood pressure checked once a year by your healthcare provider.  Normal blood pressure is 120/80  Weight- Have your body mass index (BMI) calculated to screen for obesity.  BMI is a measure of body fat based on height and weight. You can also calculate your own BMI at [email or web address].  Cholesterol- Have your cholesterol checked every year.  Diabetes- Have your blood sugar checked regularly if you have high blood pressure, high cholesterol, have a family history of diabetes or if you are overweight.  Screening for Colon Cancer- Colonoscopy starting at age 50.  Screening may begin sooner depending on your family history and other health conditions. Follow up colonoscopy as directed by your Gastroenterologist.  Screening for Prostate Cancer- Both blood work (PSA) and a rectal exam help screen for Prostate Cancer.  Screening begins at age 2 with African-American men and at age 39 with Caucasian men.  Screening may begin sooner depending on your family history.  Take these medicines  Aspirin- One aspirin daily can help prevent Heart disease and Stroke.  Flu shot- Every fall.  Tetanus- Every 10 years.  Zostavax- Once after the age of 26 to prevent Shingles.  Pneumonia shot- Once after the age of 51; if you are younger than 40, ask your healthcare provider if you need a Pneumonia shot.  Take these steps  Don't smoke- If you do smoke, talk to your doctor about quitting.  For tips on how to quit, go to www.smokefree.gov or call 1-800-QUIT-NOW.  Be physically active-  Exercise 5 days a week for at least 30 minutes.  If you are not already physically active start slow and gradually work up to 30 minutes of moderate physical activity.  Examples of moderate activity include walking briskly, mowing the yard, dancing, swimming, bicycling, etc.  Eat a healthy diet- Eat a variety of healthy food such as fruits, vegetables, low fat milk, low fat cheese, yogurt, lean meant, poultry, fish, beans, tofu, etc. For more information go to www.thenutritionsource.org  Drink alcohol in moderation- Limit alcohol intake to less than two drinks a day. Never drink and drive.  Dentist- Brush and floss twice daily; visit your dentist twice a year.  Depression- Your emotional health is as important as your physical health. If you're feeling down, or losing interest in things you would normally enjoy please talk to your healthcare provider.  Eye exam- Visit your eye doctor every year.  Safe sex- If you may be exposed to a sexually transmitted infection, use a condom.  Seat belts- Seat belts can save your life; always wear one.  Smoke/Carbon Monoxide detectors- These detectors need to be installed on the appropriate level of your home.  Replace batteries at least once a year.  Skin cancer- When out in the sun, cover up and use sunscreen 15 SPF or higher.  Violence- If anyone is threatening you, please tell your healthcare provider.  Living Will/ Health care power of attorney- Speak with your healthcare provider and family.  Stress Stress is a normal reaction to life events. Stress is what you feel  when life demands more than you are used to, or more than you think you can handle. Some stress can be useful, such as studying for a test or meeting a deadline at work. Stress that occurs too often or for too long can cause problems. It can affect your emotional health and interfere with relationships and normal daily activities. Too much stress can weaken your body's defense system  (immune system) and increase your risk for physical illness. If you already have a medical problem, stress can make it worse. What are the causes? All sorts of life events can cause stress. An event that causes stress for one person may not be stressful for another person. Major life events, whether positive or negative, commonly cause stress. Examples include:  Losing a job or starting a new job.  Losing a loved one.  Moving to a new town or home.  Getting married or divorced.  Having a baby.  Injury or illness. Less obvious life events can also cause stress, especially if they occur day after day or in combination with each other. Examples include:  Working long hours.  Driving in traffic.  Caring for children.  Being in debt.  Being in a difficult relationship. What are the signs or symptoms? Stress can cause emotional symptoms, including:  Anxiety. This is feeling worried, afraid, on edge, overwhelmed, or out of control.  Anger, including irritation or impatience.  Depression. This is feeling sad, down, helpless, or guilty.  Trouble focusing, remembering, or making decisions. Stress can cause physical symptoms, including:  Aches and pains. These may affect your head, neck, back, stomach, or other areas of your body.  Tight muscles or a clenched jaw.  Low energy.  Trouble sleeping. Stress can cause unhealthy behaviors, including:  Eating to feel better (overeating) or skipping meals.  Working too much or putting off tasks.  Smoking, drinking alcohol, or using drugs to feel better. How is this diagnosed? Stress is diagnosed through an assessment by your health care provider. He or she may diagnose this condition based on:  Your symptoms and any stressful life events.  Your medical history.  Tests to rule out other causes of your symptoms. Depending on your condition, your health care provider may refer you to a specialist for further evaluation. How is  this treated?  Stress management techniques are the recommended treatment for stress. Medicine is not typically recommended for the treatment of stress. Techniques to reduce your reaction to stressful life events include:  Stress identification. Monitor yourself for symptoms of stress and identify what causes stress for you. These skills may help you to avoid or prepare for stressful events.  Time management. Set your priorities, keep a calendar of events, and learn to say "no." Taking these actions can help you avoid making too many commitments. Techniques for coping with stress include:  Rethinking the problem. Try to think realistically about stressful events rather than ignoring them or overreacting. Try to find the positives in a stressful situation rather than focusing on the negatives.  Exercise. Physical exercise can release both physical and emotional tension. The key is to find a form of exercise that you enjoy and do it regularly.  Relaxation techniques. These relax the body and mind. The key is to find one or more that you enjoy and use the technique(s) regularly. Examples include: ? Meditation, deep breathing, or progressive relaxation techniques. ? Yoga or tai chi. ? Biofeedback, mindfulness techniques, or journaling. ? Listening to music, being out in  nature, or participating in other hobbies.  Practicing a healthy lifestyle. Eat a balanced diet, drink plenty of water, limit or avoid caffeine, and get plenty of sleep.  Having a strong support network. Spend time with family, friends, or other people you enjoy being around. Express your feelings and talk things over with someone you trust. Counseling or talk therapy with a mental health professional may be helpful if you are having trouble managing stress on your own. Follow these instructions at home: Lifestyle   Avoid drugs.  Do not use any products that contain nicotine or tobacco, such as cigarettes and e-cigarettes.  If you need help quitting, ask your health care provider.  Limit alcohol intake to no more than 1 drink a day for nonpregnant women and 2 drinks a day for men. One drink equals 12 oz of beer, 5 oz of wine, or 1 oz of hard liquor.  Do not use alcohol or drugs to relax.  Eat a balanced diet that includes fresh fruits and vegetables, whole grains, lean meats, fish, eggs, and beans, and low-fat dairy. Avoid processed foods and foods high in added fat, sugar, and salt.  Exercise at least 30 minutes on 5 or more days each week.  Get 7-8 hours of sleep each night. General instructions   Practice stress management techniques as discussed with your health care provider.  Drink enough fluid to keep your urine clear or pale yellow.  Take over-the-counter and prescription medicines only as told by your health care provider.  Keep all follow-up visits as told by your health care provider. This is important. Contact a health care provider if:  Your symptoms get worse.  You have new symptoms.  You feel overwhelmed by your problems and can no longer manage them on your own. Get help right away if:  You have thoughts of hurting yourself or others. If you ever feel like you may hurt yourself or others, or have thoughts about taking your own life, get help right away. You can go to your nearest emergency department or call:  Your local emergency services (911 in the [place]  A suicide crisis helpline, such as the [facility] at 2562483762. This is open 24 hours a day. Summary  Stress is a normal reaction to life events. It can cause problems if it happens too often or for too long.  Practicing stress management techniques is the best way to treat stress.  Counseling or talk therapy with a mental health professional may be helpful if you are having trouble managing stress on your own. This information is not intended to replace advice given to you by your health  care provider. Make sure you discuss any questions you have with your health care provider. Document Released: 02/01/2001 Document Revised: 07/21/2017 Document Reviewed: 09/28/2016 Elsevier Patient Education  [place].  Insomnia Insomnia is a sleep disorder that makes it difficult to fall asleep or stay asleep. Insomnia can cause fatigue, low energy, difficulty concentrating, mood swings, and poor performance at work or school. There are three different ways to classify insomnia:  Difficulty falling asleep.  Difficulty staying asleep.  Waking up too early in the morning. Any type of insomnia can be long-term (chronic) or short-term (acute). Both are common. Short-term insomnia usually lasts for three months or less. Chronic insomnia occurs at least three times a week for longer than three months. What are the causes? Insomnia may be caused by another condition, situation, or substance, such as:  Anxiety.  Certain medicines.  Gastroesophageal reflux disease (GERD) or other gastrointestinal conditions.  Asthma or other breathing conditions.  Restless legs syndrome, sleep apnea, or other sleep disorders.  Chronic pain.  Menopause.  Stroke.  Abuse of alcohol, tobacco, or illegal drugs.  Mental health conditions, such as depression.  Caffeine.  Neurological disorders, such as Alzheimer's disease.  An overactive thyroid (hyperthyroidism). Sometimes, the cause of insomnia may not be known. What increases the risk? Risk factors for insomnia include:  Gender. Women are affected more often than men.  Age. Insomnia is more common as you get older.  Stress.  Lack of exercise.  Irregular work schedule or working night shifts.  Traveling between different time zones.  Certain medical and mental health conditions. What are the signs or symptoms? If you have insomnia, the main symptom is having trouble falling asleep or having trouble staying asleep. This may  lead to other symptoms, such as:  Feeling fatigued or having low energy.  Feeling nervous about going to sleep.  Not feeling rested in the morning.  Having trouble concentrating.  Feeling irritable, anxious, or depressed. How is this diagnosed? This condition may be diagnosed based on:  Your symptoms and medical history. Your health care provider may ask about: ? Your sleep habits. ? Any medical conditions you have. ? Your mental health.  A physical exam. How is this treated? Treatment for insomnia depends on the cause. Treatment may focus on treating an underlying condition that is causing insomnia. Treatment may also include:  Medicines to help you sleep.  Counseling or therapy.  Lifestyle adjustments to help you sleep better. Follow these instructions at home: Eating and drinking   Limit or avoid alcohol, caffeinated beverages, and cigarettes, especially close to bedtime. These can disrupt your sleep.  Do not eat a large meal or eat spicy foods right before bedtime. This can lead to digestive discomfort that can make it hard for you to sleep. Sleep habits   Keep a sleep diary to help you and your health care provider figure out what could be causing your insomnia. Write down: ? When you sleep. ? When you wake up during the night. ? How well you sleep. ? How rested you feel the next day. ? Any side effects of medicines you are taking. ? What you eat and drink.  Make your bedroom a dark, comfortable place where it is easy to fall asleep. ? Put up shades or blackout curtains to block light from outside. ? Use a white noise machine to block noise. ? Keep the temperature cool.  Limit screen use before bedtime. This includes: ? Watching TV. ? Using your smartphone, tablet, or computer.  Stick to a routine that includes going to bed and waking up at the same times every day and night. This can help you fall asleep faster. Consider making a quiet activity, such as  reading, part of your nighttime routine.  Try to avoid taking naps during the day so that you sleep better at night.  Get out of bed if you are still awake after 15 minutes of trying to sleep. Keep the lights down, but try reading or doing a quiet activity. When you feel sleepy, go back to bed. General instructions  Take over-the-counter and prescription medicines only as told by your health care provider.  Exercise regularly, as told by your health care provider. Avoid exercise starting several hours before bedtime.  Use relaxation techniques to manage stress. Ask your health care provider  to suggest some techniques that may work well for you. These may include: ? Breathing exercises. ? Routines to release muscle tension. ? Visualizing peaceful scenes.  Make sure that you drive carefully. Avoid driving if you feel very sleepy.  Keep all follow-up visits as told by your health care provider. This is important. Contact a health care provider if:  You are tired throughout the day.  You have trouble in your daily routine due to sleepiness.  You continue to have sleep problems, or your sleep problems get worse. Get help right away if:  You have serious thoughts about hurting yourself or someone else. If you ever feel like you may hurt yourself or others, or have thoughts about taking your own life, get help right away. You can go to your nearest emergency department or call:  Your local emergency services (911 in the [place]  A suicide crisis helpline, such as the [facility] at 7374527782. This is open 24 hours a day. Summary  Insomnia is a sleep disorder that makes it difficult to fall asleep or stay asleep.  Insomnia can be long-term (chronic) or short-term (acute).  Treatment for insomnia depends on the cause. Treatment may focus on treating an underlying condition that is causing insomnia.  Keep a sleep diary to help you and your health care  provider figure out what could be causing your insomnia. This information is not intended to replace advice given to you by your health care provider. Make sure you discuss any questions you have with your health care provider. Document Released: 08/05/2000 Document Revised: 07/21/2017 Document Reviewed: 05/18/2017 Elsevier Patient Education  [employer].    If you have lab work done today you will be contacted with your lab results within the next 2 weeks.  If you have not heard from [place] then please contact us. The fastest way to get your results is to register for My Chart.   IF you received an x-ray today, you will receive an invoice from [facility]. Please contact [facility] at [phone] with questions or concerns regarding your invoice.   IF you received labwork today, you will receive an invoice from [facility]. Please contact [facility] at [phone] with questions or concerns regarding your invoice.   Our billing staff will not be able to assist you with questions regarding bills from these companies.  You will be contacted with the lab results as soon as they are available. The fastest way to get your results is to activate your My Chart account. Instructions are located on the last page of this paperwork. If you have not heard from [place] regarding the results in 2 weeks, please contact this office.

## 2019-05-30 NOTE — Progress Notes (Signed)
Subjective:    Patient ID: Jeffery Clements, male    DOB: [birth year], 63 y.o.   MRN: [id number]  HPI Jeffery Clements is a 63 y.o. male Presents today for: Chief Complaint  Patient presents with  . Annual Exam    here for annual exam with no other issues at this time   Hearing loss: History of tinnitus, sensorineural hearing loss.  Evaluated by ENT in 2017.  Hearing aids recommended for bilateral significant hearing loss in high frequencies primarily. Has hearing aids, not wearing at home. Min improvement in tinnitus when wearing. Plans on follow up with ENT/audiology.   Hypothyroidism: Lab Results  Component Value Date   TSH 3.880 05/09/2018  synthroid 38mg Qd Taking medication daily.  No new hot or cold intolerance. No new hair or skin changes, heart palpitations or new fatigue. No new weight changes.   Hyperlipidemia: Not currently on meds.  Lab Results  Component Value Date   CHOL 214 (H) 05/09/2018   HDL 55 05/09/2018   LDLCALC 132 (H) 05/09/2018   TRIG 136 05/09/2018   CHOLHDL 3.9 05/09/2018   Lab Results  Component Value Date   ALT 28 05/09/2018   AST 23 05/09/2018   ALKPHOS 94 05/09/2018   BILITOT 1.1 05/09/2018  The 10-year ASCVD risk score (Mikey BussingDC Jr., et al., 2013) is: 9%   Values used to calculate the score:     Age: [age]years     Sex: Male     Is Non-Hispanic African American: No     Diabetic: No     Tobacco smoker: No     Systolic Blood Pressure: 1798mmHg     Is BP treated: No     HDL Cholesterol: 55 mg/dL     Total Cholesterol: 214 mg/dL   Cancer screening: Colonoscopy 08/03/2017, repeat 10 years Prostate-followed by urology, appointment August 2019.  BPH  Evaluated by urology, Dr. BPilar Jarvis prostate cancer screening with annual follow-up.  No specific treatment when discussed in August 2019. Plans on scheduling appt. No dysuria, no new sx's. No nocturia.   Some difficulty with sleep at times - busier at work. Wakes up at 4 or 5 at times.  Sleep onset about midnight. No snoring, no hx of OSA. Mouth guard for grinding teeth. Less exercise, more fast food, no specific stress mgt.   Skin cancer, basal cell carcinoma Dermatology, Dr. LAllyson Sabal  Left frontal scalp basal cell in November 2019. Plans to schedule follow up visit.   Immunization History  Administered Date(s) Administered  . Influenza,inj,Quad PF,6+ Mos 05/09/2018  . Influenza-Unspecified 05/21/2014  . Tdap 02/15/2012  . Zoster 03/22/2014  shingrix:has not had.  Flu: today.   [facility] 05/30/2019 02/18/2019 07/31/2018 05/09/2018 08/21/2017  Decreased Interest 0 0 0 0 0  Down, Depressed, Hopeless 0 0 0 0 0  PHQ - 2 Score 0 0 0 0 0    Hearing Screening   _0  _1  _2  _3  _4  _5  _6  _7  _8   Right ear:           Left ear:             Visual Acuity Screening   Right eye Left eye Both eyes  Without correction:     With correction: _9  plans on scheduling appt.   Dental: every 6 months.   Exercise:  Less recently. Plans to restart.    Patient Active Problem List   Diagnosis Date Noted  . Cough 08/21/2017  .  Lower respiratory infection 08/21/2017  . Dyspnea on exertion 05/20/2016  . Sensorineural hearing loss (SNHL) 05/17/2016  . Subjective tinnitus of both ears 05/17/2016  . Hematuria 04/14/2015  . Hypertriglyceridemia 09/11/2012  . Rectal fissure 09/11/2012  . Colon polyps 09/11/2012   No past medical history on file. Past Surgical History:  Procedure Laterality Date  . VASECTOMY     No Known Allergies Prior to Admission medications   Medication Sig Start Date End Date Taking? Authorizing Provider  clobetasol cream (TEMOVATE) 7.32 % Apply 1 application topically 2 (two) times daily. 04/07/14  Yes Darlyne Russian, MD  levothyroxine (SYNTHROID) 75 MCG tablet TAKE ONE TABLET BY MOUTH ONCE DAILY BEFORE  BREAKFAST 05/13/19  Yes Wendie Agreste, MD  Multiple Vitamins-Minerals (CENTRUM SILVER ADULT  50+) TABS Take by mouth.   Yes [provider]  Triamcinolone Acetonide (TRIAMCINOLONE 0.1 % CREAM : EUCERIN) CREA Apply 1 application topically 3 (three) times daily. 07/10/14  Yes Darlyne Russian, MD   Social History   Socioeconomic History  . Marital status: Divorced    Spouse name: Not on file  . Number of children: Not on file  . Years of education: Not on file  . Highest education level: Not on file  Occupational History  . Occupation: Aeronautical engineer: [employer]  . Financial resource strain: Not on file  . Food insecurity    Worry: Not on file    Inability: Not on file  . Transportation needs    Medical: Not on file    Non-medical: Not on file  Tobacco Use  . Smoking status: Never Smoker  . Smokeless tobacco: Never Used  Substance and Sexual Activity  . Alcohol use: No  . Drug use: No  . Sexual activity: Not on file  Lifestyle  . Physical activity    Days per week: Not on file    Minutes per session: Not on file  . Stress: Not on file  Relationships  . Social Herbalist on phone: Not on file    Gets together: Not on file    Attends religious service: Not on file    Active member of club or organization: Not on file    Attends meetings of clubs or organizations: Not on file    Relationship status: Not on file  . Intimate partner violence    Fear of current or ex partner: Not on file    Emotionally abused: Not on file    Physically abused: Not on file    Forced sexual activity: Not on file  Other Topics Concern  . Not on file  Social History Narrative   Divorced. Education: [employer]. Exercise: [employer] 3 times a week for 30 minutes.    Review of Systems 13 point review of systems per patient health survey noted.  Negative other than as indicated above or in HPI.      Objective:   Physical Exam Vitals signs reviewed.  Constitutional:      Appearance: He is well-developed.  HENT:     Head: Normocephalic and  atraumatic.     Right Ear: External ear normal.     Left Ear: External ear normal.  Eyes:     Conjunctiva/sclera: Conjunctivae normal.     Pupils: Pupils are equal, round, and reactive to light.  Neck:     Musculoskeletal: Normal range of motion and neck supple.     Thyroid: No thyromegaly.  Cardiovascular:  Rate and Rhythm: Normal rate and regular rhythm.     Heart sounds: Normal heart sounds.  Pulmonary:     Effort: Pulmonary effort is normal. No respiratory distress.     Breath sounds: Normal breath sounds. No wheezing.  Abdominal:     General: There is no distension.     Palpations: Abdomen is soft.     Tenderness: There is no abdominal tenderness.     Hernia: A hernia is present.  Musculoskeletal: Normal range of motion.        General: No tenderness.  Lymphadenopathy:     Cervical: No cervical adenopathy.  Skin:    General: Skin is warm and dry.  Neurological:     Mental Status: He is alert and oriented to person, place, and time.     Deep Tendon Reflexes: Reflexes are normal and symmetric.  Psychiatric:        Behavior: Behavior normal.    Vitals:   05/30/19 0848  BP: 109/72  Pulse: 77  Temp: 98.4 F (36.9 C)  TempSrc: Oral  SpO2: 97%  Weight: 188 lb 3.2 oz (85.4 kg)       Assessment & Plan:    Almond Fitzgibbon is a 63 y.o. male Annual physical exam  - -anticipatory guidance as below in AVS, screening labs above. Health maintenance items as above in HPI discussed/recommended as applicable.   -Follow-up discussed with ENT, urology, dermatology  Need for prophylactic vaccination and inoculation against influenza - Plan: Flu Vaccine QUAD 6+ mos PF IM (Fluarix Quad PF)  Hypothyroidism, unspecified type - Plan: TSH  Stable, tolerating current regimen. Medications refilled. Labs pending as above.   Hyperlipidemia, unspecified hyperlipidemia type - Plan: Lipid Panel, Comprehensive metabolic panel  -Borderline ASCVD risk, recheck labs.  Diet, exercise  discussed with resumption of exercise.  Insomnia, unspecified type  -trial of melatonin, stress mgt techniques discussed, exercise, handout given.  Follow-up if persistent  Benign prostatic hyperplasia, unspecified whether lower urinary tract symptoms present Screening for malignant neoplasm of prostate - Plan: PSA  - We discussed pros and cons of prostate cancer screening, and after this discussion, he chose to have screening done with PSA obtained, plan for follow-up with urology.  Need for shingles vaccine - Plan: Zoster Vaccine Adjuvanted [facility]) injection   Meds ordered this encounter  Medications  . Zoster Vaccine Adjuvanted [facility]) injection    Sig: Inject 0.5 mLs into the muscle once for 1 dose. Repeat in 2-6 months.    Dispense:  0.5 mL    Refill:  1   Patient Instructions   Try melatonin and other info for sleep below. Restart low intensity exercise most days per week as a goal.  and other stress management discussed below. Recheck in 6 weeks.  Return to the clinic or go to the nearest emergency room if any of your symptoms worsen or new symptoms occur.  Call dermatology, urology, ENT, and eye provider for appointment.   Keeping you healthy  Get these tests  Blood pressure- Have your blood pressure checked once a year by your healthcare provider.  Normal blood pressure is 120/80  Weight- Have your body mass index (BMI) calculated to screen for obesity.  BMI is a measure of body fat based on height and weight. You can also calculate your own BMI at [employer].  Cholesterol- Have your cholesterol checked every year.  Diabetes- Have your blood sugar checked regularly if you have high blood pressure, high cholesterol, have a family history of diabetes  or if you are overweight.  Screening for Colon Cancer- Colonoscopy starting at age 37.  Screening may begin sooner depending on your family history and other health conditions. Follow up colonoscopy as  directed by your Gastroenterologist.  Screening for Prostate Cancer- Both blood work (PSA) and a rectal exam help screen for Prostate Cancer.  Screening begins at age 50 with African-American men and at age 16 with Caucasian men.  Screening may begin sooner depending on your family history.  Take these medicines  Aspirin- One aspirin daily can help prevent Heart disease and Stroke.  Flu shot- Every fall.  Tetanus- Every 10 years.  Zostavax- Once after the age of 6 to prevent Shingles.  Pneumonia shot- Once after the age of 48; if you are younger than 50, ask your healthcare provider if you need a Pneumonia shot.  Take these steps  Don't smoke- If you do smoke, talk to your doctor about quitting.  For tips on how to quit, go to www.smokefree.gov or call 1-800-QUIT-NOW.  Be physically active- Exercise 5 days a week for at least 30 minutes.  If you are not already physically active start slow and gradually work up to 30 minutes of moderate physical activity.  Examples of moderate activity include walking briskly, mowing the yard, dancing, swimming, bicycling, etc.  Eat a healthy diet- Eat a variety of healthy food such as fruits, vegetables, low fat milk, low fat cheese, yogurt, lean meant, poultry, fish, beans, tofu, etc. For more information go to www.thenutritionsource.org  Drink alcohol in moderation- Limit alcohol intake to less than two drinks a day. Never drink and drive.  Dentist- Brush and floss twice daily; visit your dentist twice a year.  Depression- Your emotional health is as important as your physical health. If you're feeling down, or losing interest in things you would normally enjoy please talk to your healthcare provider.  Eye exam- Visit your eye doctor every year.  Safe sex- If you may be exposed to a sexually transmitted infection, use a condom.  Seat belts- Seat belts can save your life; always wear one.  Smoke/Carbon Monoxide detectors- These detectors need  to be installed on the appropriate level of your home.  Replace batteries at least once a year.  Skin cancer- When out in the sun, cover up and use sunscreen 15 SPF or higher.  Violence- If anyone is threatening you, please tell your healthcare provider.  Living Will/ Health care power of attorney- Speak with your healthcare provider and family.  Stress Stress is a normal reaction to life events. Stress is what you feel when life demands more than you are used to, or more than you think you can handle. Some stress can be useful, such as studying for a test or meeting a deadline at work. Stress that occurs too often or for too long can cause problems. It can affect your emotional health and interfere with relationships and normal daily activities. Too much stress can weaken your body's defense system (immune system) and increase your risk for physical illness. If you already have a medical problem, stress can make it worse. What are the causes? All sorts of life events can cause stress. An event that causes stress for one person may not be stressful for another person. Major life events, whether positive or negative, commonly cause stress. Examples include:  Losing a job or starting a new job.  Losing a loved one.  Moving to a new town or home.  Getting married or divorced.  Having a baby.  Injury or illness. Less obvious life events can also cause stress, especially if they occur day after day or in combination with each other. Examples include:  Working long hours.  Driving in traffic.  Caring for children.  Being in debt.  Being in a difficult relationship. What are the signs or symptoms? Stress can cause emotional symptoms, including:  Anxiety. This is feeling worried, afraid, on edge, overwhelmed, or out of control.  Anger, including irritation or impatience.  Depression. This is feeling sad, down, helpless, or guilty.  Trouble focusing, remembering, or making  decisions. Stress can cause physical symptoms, including:  Aches and pains. These may affect your head, neck, back, stomach, or other areas of your body.  Tight muscles or a clenched jaw.  Low energy.  Trouble sleeping. Stress can cause unhealthy behaviors, including:  Eating to feel better (overeating) or skipping meals.  Working too much or putting off tasks.  Smoking, drinking alcohol, or using drugs to feel better. How is this diagnosed? Stress is diagnosed through an assessment by your health care provider. He or she may diagnose this condition based on:  Your symptoms and any stressful life events.  Your medical history.  Tests to rule out other causes of your symptoms. Depending on your condition, your health care provider may refer you to a specialist for further evaluation. How is this treated?  Stress management techniques are the recommended treatment for stress. Medicine is not typically recommended for the treatment of stress. Techniques to reduce your reaction to stressful life events include:  Stress identification. Monitor yourself for symptoms of stress and identify what causes stress for you. These skills may help you to avoid or prepare for stressful events.  Time management. Set your priorities, keep a calendar of events, and learn to say "no." Taking these actions can help you avoid making too many commitments. Techniques for coping with stress include:  Rethinking the problem. Try to think realistically about stressful events rather than ignoring them or overreacting. Try to find the positives in a stressful situation rather than focusing on the negatives.  Exercise. Physical exercise can release both physical and emotional tension. The key is to find a form of exercise that you enjoy and do it regularly.  Relaxation techniques. These relax the body and mind. The key is to find one or more that you enjoy and use the technique(s) regularly. Examples  include: ? Meditation, deep breathing, or progressive relaxation techniques. ? Yoga or tai chi. ? Biofeedback, mindfulness techniques, or journaling. ? Listening to music, being out in nature, or participating in other hobbies.  Practicing a healthy lifestyle. Eat a balanced diet, drink plenty of water, limit or avoid caffeine, and get plenty of sleep.  Having a strong support network. Spend time with family, friends, or other people you enjoy being around. Express your feelings and talk things over with someone you trust. Counseling or talk therapy with a mental health professional may be helpful if you are having trouble managing stress on your own. Follow these instructions at home: Lifestyle   Avoid drugs.  Do not use any products that contain nicotine or tobacco, such as cigarettes and e-cigarettes. If you need help quitting, ask your health care provider.  Limit alcohol intake to no more than 1 drink a day for nonpregnant women and 2 drinks a day for men. One drink equals 12 oz of beer, 5 oz of wine, or 1 oz of hard liquor.  Do not  use alcohol or drugs to relax.  Eat a balanced diet that includes fresh fruits and vegetables, whole grains, lean meats, fish, eggs, and beans, and low-fat dairy. Avoid processed foods and foods high in added fat, sugar, and salt.  Exercise at least 30 minutes on 5 or more days each week.  Get 7-8 hours of sleep each night. General instructions   Practice stress management techniques as discussed with your health care provider.  Drink enough fluid to keep your urine clear or pale yellow.  Take over-the-counter and prescription medicines only as told by your health care provider.  Keep all follow-up visits as told by your health care provider. This is important. Contact a health care provider if:  Your symptoms get worse.  You have new symptoms.  You feel overwhelmed by your problems and can no longer manage them on your own. Get help  right away if:  You have thoughts of hurting yourself or others. If you ever feel like you may hurt yourself or others, or have thoughts about taking your own life, get help right away. You can go to your nearest emergency department or call:  Your local emergency services (911 in the [place]  A suicide crisis helpline, such as the [place] at 9860341679. This is open 24 hours a day. Summary  Stress is a normal reaction to life events. It can cause problems if it happens too often or for too long.  Practicing stress management techniques is the best way to treat stress.  Counseling or talk therapy with a mental health professional may be helpful if you are having trouble managing stress on your own. This information is not intended to replace advice given to you by your health care provider. Make sure you discuss any questions you have with your health care provider. Document Released: 02/01/2001 Document Revised: 07/21/2017 Document Reviewed: 09/28/2016 Elsevier Patient Education  [place].  Insomnia Insomnia is a sleep disorder that makes it difficult to fall asleep or stay asleep. Insomnia can cause fatigue, low energy, difficulty concentrating, mood swings, and poor performance at work or school. There are three different ways to classify insomnia:  Difficulty falling asleep.  Difficulty staying asleep.  Waking up too early in the morning. Any type of insomnia can be long-term (chronic) or short-term (acute). Both are common. Short-term insomnia usually lasts for three months or less. Chronic insomnia occurs at least three times a week for longer than three months. What are the causes? Insomnia may be caused by another condition, situation, or substance, such as:  Anxiety.  Certain medicines.  Gastroesophageal reflux disease (GERD) or other gastrointestinal conditions.  Asthma or other breathing conditions.  Restless legs syndrome,  sleep apnea, or other sleep disorders.  Chronic pain.  Menopause.  Stroke.  Abuse of alcohol, tobacco, or illegal drugs.  Mental health conditions, such as depression.  Caffeine.  Neurological disorders, such as Alzheimer's disease.  An overactive thyroid (hyperthyroidism). Sometimes, the cause of insomnia may not be known. What increases the risk? Risk factors for insomnia include:  Gender. Women are affected more often than men.  Age. Insomnia is more common as you get older.  Stress.  Lack of exercise.  Irregular work schedule or working night shifts.  Traveling between different time zones.  Certain medical and mental health conditions. What are the signs or symptoms? If you have insomnia, the main symptom is having trouble falling asleep or having trouble staying asleep. This may lead to other  symptoms, such as:  Feeling fatigued or having low energy.  Feeling nervous about going to sleep.  Not feeling rested in the morning.  Having trouble concentrating.  Feeling irritable, anxious, or depressed. How is this diagnosed? This condition may be diagnosed based on:  Your symptoms and medical history. Your health care provider may ask about: ? Your sleep habits. ? Any medical conditions you have. ? Your mental health.  A physical exam. How is this treated? Treatment for insomnia depends on the cause. Treatment may focus on treating an underlying condition that is causing insomnia. Treatment may also include:  Medicines to help you sleep.  Counseling or therapy.  Lifestyle adjustments to help you sleep better. Follow these instructions at home: Eating and drinking   Limit or avoid alcohol, caffeinated beverages, and cigarettes, especially close to bedtime. These can disrupt your sleep.  Do not eat a large meal or eat spicy foods right before bedtime. This can lead to digestive discomfort that can make it hard for you to sleep. Sleep habits    Keep a sleep diary to help you and your health care provider figure out what could be causing your insomnia. Write down: ? When you sleep. ? When you wake up during the night. ? How well you sleep. ? How rested you feel the next day. ? Any side effects of medicines you are taking. ? What you eat and drink.  Make your bedroom a dark, comfortable place where it is easy to fall asleep. ? Put up shades or blackout curtains to block light from outside. ? Use a white noise machine to block noise. ? Keep the temperature cool.  Limit screen use before bedtime. This includes: ? Watching TV. ? Using your smartphone, tablet, or computer.  Stick to a routine that includes going to bed and waking up at the same times every day and night. This can help you fall asleep faster. Consider making a quiet activity, such as reading, part of your nighttime routine.  Try to avoid taking naps during the day so that you sleep better at night.  Get out of bed if you are still awake after 15 minutes of trying to sleep. Keep the lights down, but try reading or doing a quiet activity. When you feel sleepy, go back to bed. General instructions  Take over-the-counter and prescription medicines only as told by your health care provider.  Exercise regularly, as told by your health care provider. Avoid exercise starting several hours before bedtime.  Use relaxation techniques to manage stress. Ask your health care provider to suggest some techniques that may work well for you. These may include: ? Breathing exercises. ? Routines to release muscle tension. ? Visualizing peaceful scenes.  Make sure that you drive carefully. Avoid driving if you feel very sleepy.  Keep all follow-up visits as told by your health care provider. This is important. Contact a health care provider if:  You are tired throughout the day.  You have trouble in your daily routine due to sleepiness.  You continue to have sleep problems,  or your sleep problems get worse. Get help right away if:  You have serious thoughts about hurting yourself or someone else. If you ever feel like you may hurt yourself or others, or have thoughts about taking your own life, get help right away. You can go to your nearest emergency department or call:  Your local emergency services (911 in the [place]  A suicide crisis helpline, such as  the National Suicide Prevention Lifeline at (337)641-3600. This is open 24 hours a day. Summary  Insomnia is a sleep disorder that makes it difficult to fall asleep or stay asleep.  Insomnia can be long-term (chronic) or short-term (acute).  Treatment for insomnia depends on the cause. Treatment may focus on treating an underlying condition that is causing insomnia.  Keep a sleep diary to help you and your health care provider figure out what could be causing your insomnia. This information is not intended to replace advice given to you by your health care provider. Make sure you discuss any questions you have with your health care provider. Document Released: 08/05/2000 Document Revised: 07/21/2017 Document Reviewed: 05/18/2017 Elsevier Patient Education  [employer].    If you have lab work done today you will be contacted with your lab results within the next 2 weeks.  If you have not heard from [place] then please contact us. The fastest way to get your results is to register for My Chart.   IF you received an x-ray today, you will receive an invoice from [facility] Rad[facility]. Please contact [facility] at [phone] with questions or concerns regarding your invoice.   IF you received labwork today, you will receive an invoice from [facility]. Please contact [facility] at [phone] with questions or concerns regarding your invoice.   Our billing staff will not be able to assist you with questions regarding bills from these companies.  You will be contacted with the lab results as soon  as they are available. The fastest way to get your results is to activate your My Chart account. Instructions are located on the last page of this paperwork. If you have not heard from [place] regarding the results in 2 weeks, please contact this office.       Signed,   [name], MD [facility] [place].  06/01/19 8:30 AM

## 2019-05-31 LAB — COMPREHENSIVE METABOLIC PANEL
ALT: 32 IU/L (ref 0–44)
AST: 27 IU/L (ref 0–40)
Albumin/Globulin Ratio: 1.7 (ref 1.2–2.2)
Albumin: 4.5 g/dL (ref 3.8–4.8)
Alkaline Phosphatase: 104 IU/L (ref 39–117)
BUN/Creatinine Ratio: 16 (ref 10–24)
BUN: 15 mg/dL (ref 8–27)
Bilirubin Total: 1.1 mg/dL (ref 0.0–1.2)
CO2: 22 mmol/L (ref 20–29)
Calcium: 9.6 mg/dL (ref 8.6–10.2)
Chloride: 103 mmol/L (ref 96–106)
Creatinine, Ser: 0.91 mg/dL (ref 0.76–1.27)
GFR calc Af Amer: 103 mL/min/{1.73_m2} (ref >59)
GFR calc non Af Amer: 89 mL/min/{1.73_m2} (ref >59)
Globulin, Total: 2.6 g/dL (ref 1.5–4.5)
Glucose: 99 mg/dL (ref 65–99)
Potassium: 4.5 mmol/L (ref 3.5–5.2)
Sodium: 140 mmol/L (ref 134–144)
Total Protein: 7.1 g/dL (ref 6.0–8.5)

## 2019-05-31 LAB — LIPID PANEL
Chol/HDL Ratio: 4.2 ratio (ref 0.0–5.0)
Cholesterol, Total: 219 mg/dL — ABNORMAL HIGH (ref 100–199)
HDL: 52 mg/dL (ref >39)
LDL Chol Calc (NIH): 141 mg/dL — ABNORMAL HIGH (ref 0–99)
Triglycerides: 147 mg/dL (ref 0–149)
VLDL Cholesterol Cal: 26 mg/dL (ref 5–40)

## 2019-05-31 LAB — TSH: TSH: 2.84 u[IU]/mL (ref 0.450–4.500)

## 2019-05-31 LAB — PSA: Prostate Specific Ag, Serum: 0.7 ng/mL (ref 0.0–4.0)

## 2019-06-12 ENCOUNTER — Other Ambulatory Visit: Payer: Self-pay | Admitting: Family Medicine

## 2019-06-12 DIAGNOSIS — E039 Hypothyroidism, unspecified: Secondary | ICD-10-CM

## 2019-06-14 MED ORDER — LEVOTHYROXINE SODIUM 75 MCG PO TABS: ORAL_TABLET | ORAL | 0 refills | Status: DC

## 2019-07-11 ENCOUNTER — Encounter: Payer: Self-pay | Admitting: Family Medicine

## 2019-07-11 ENCOUNTER — Ambulatory Visit (INDEPENDENT_AMBULATORY_CARE_PROVIDER_SITE_OTHER): Payer: Managed Care, Other (non HMO) | Admitting: Family Medicine

## 2019-07-11 ENCOUNTER — Other Ambulatory Visit: Payer: Self-pay

## 2019-07-11 VITALS — BP 107/71 | HR 71 | Temp 98.5°F | Wt 190.8 lb

## 2019-07-11 DIAGNOSIS — G4700 Insomnia, unspecified: Secondary | ICD-10-CM

## 2019-07-11 DIAGNOSIS — E039 Hypothyroidism, unspecified: Secondary | ICD-10-CM | POA: Diagnosis not present

## 2019-07-11 DIAGNOSIS — E785 Hyperlipidemia, unspecified: Secondary | ICD-10-CM

## 2019-07-11 MED ORDER — LEVOTHYROXINE SODIUM 75 MCG PO TABS: ORAL_TABLET | ORAL | 2 refills | Status: DC

## 2019-07-11 NOTE — Progress Notes (Signed)
Subjective:  Patient ID: Jeffery Clements, male    DOB: [birth year]  Age: 63 y.o. MRN: [id number]  CC:  Chief Complaint  Patient presents with  . Medical Management of Chronic Issues    6 week f/u on sleep and labs    HPI Mykai Clements presents for  Hypothyroidism: Lab Results  Component Value Date   TSH 2.840 05/30/2019  Taking medication daily.  Synthroid 75 mcg.  Refill needed  Insomnia Discussed at October physical.  At that time was waking up 4-5 times at night.  Sleep onset at midnight, no snoring or history of OSA.  Was wearing a mouthguard for grinding teeth.  Had noted less exercise, more fast food, no specific stress management at that time.  Sleep hygiene discussed, melatonin discussed, and restarting exercise.  Tried melatonin - helping some. Bedtime 11-12, wakes up 7am. Not wakening up during the night. No regular exercise.  Taking few days off per week - feels like this has helped. Does not feel stressed.   Hyperlipidemia: Slight elevated 10-year heart disease risk score, discussed option of statin versus significant diet/exercise changes with 75-month repeat testing at his October physical.  Would like to try diet/exercise approach.  Does find more eating out/fast food with son home.   Lab Results  Component Value Date   CHOL 219 (H) 05/30/2019   HDL 52 05/30/2019   LDLCALC 141 (H) 05/30/2019   TRIG 147 05/30/2019   CHOLHDL 4.2 05/30/2019   Lab Results  Component Value Date   ALT 32 05/30/2019   AST 27 05/30/2019   ALKPHOS 104 05/30/2019   BILITOT 1.1 05/30/2019  The 10-year ASCVD risk score Jeffery Bussing DC Jr., et al., 2013) is: 9.1%   Values used to calculate the score:     Age: 58 years     Sex: Male     Is Non-Hispanic African American: No     Diabetic: No     Tobacco smoker: No     Systolic Blood Pressure: [id number] mmHg     Is BP treated: No     HDL Cholesterol: 52 mg/dL     Total Cholesterol: 219 mg/dL        History Patient Active Problem  List   Diagnosis Date Noted  . Cough 08/21/2017  . Lower respiratory infection 08/21/2017  . Dyspnea on exertion 05/20/2016  . Sensorineural hearing loss (SNHL) 05/17/2016  . Subjective tinnitus of both ears 05/17/2016  . Hematuria 04/14/2015  . Hypertriglyceridemia 09/11/2012  . Rectal fissure 09/11/2012  . Colon polyps 09/11/2012   No past medical history on file. Past Surgical History:  Procedure Laterality Date  . VASECTOMY     No Known Allergies Prior to Admission medications   Medication Sig Start Date End Date Taking? Authorizing Provider  clobetasol cream (TEMOVATE) [id number] % Apply 1 application topically 2 (two) times daily. 04/07/14  Yes Jeffery Clements  levothyroxine (SYNTHROID) 75 MCG tablet TAKE ONE TABLET BY MOUTH ONCE DAILY BEFORE  BREAKFAST 06/14/19  Yes Jeffery Clements  Multiple Vitamins-Minerals (CENTRUM SILVER ADULT 50+) TABS Take by mouth.   Yes Provider, Historical, Clements  Triamcinolone Acetonide (TRIAMCINOLONE 0.1 % CREAM : EUCERIN) CREA Apply 1 application topically 3 (three) times daily. 07/10/14  Yes Jeffery Clements   Social History   Socioeconomic History  . Marital status: Divorced    Spouse name: Not on file  . Number of children: Not on file  . Years of education: Not on file  .  Highest education level: Not on file  Occupational History  . Occupation: Aeronautical engineer: [employer]  . Financial resource strain: Not on file  . Food insecurity    Worry: Not on file    Inability: Not on file  . Transportation needs    Medical: Not on file    Non-medical: Not on file  Tobacco Use  . Smoking status: Never Smoker  . Smokeless tobacco: Never Used  Substance and Sexual Activity  . Alcohol use: No  . Drug use: No  . Sexual activity: Not on file  Lifestyle  . Physical activity    Days per week: Not on file    Minutes per session: Not on file  . Stress: Not on file  Relationships  . Social Herbalist  on phone: Not on file    Gets together: Not on file    Attends religious service: Not on file    Active member of club or organization: Not on file    Attends meetings of clubs or organizations: Not on file    Relationship status: Not on file  . Intimate partner violence    Fear of current or ex partner: Not on file    Emotionally abused: Not on file    Physically abused: Not on file    Forced sexual activity: Not on file  Other Topics Concern  . Not on file  Social History Narrative   Divorced. Education: [employer]. Exercise: Bouflex 3 times a week for 30 minutes.    Review of Systems   Objective:   Vitals:   07/11/19 0911  BP: 107/71  Pulse: 71  Temp: 98.5 F (36.9 C)  TempSrc: Oral  SpO2: 97%  Weight: 190 lb 12.8 oz (86.5 kg)     Physical Exam Constitutional:      General: He is not in acute distress.    Appearance: He is well-developed.  HENT:     Head: Normocephalic and atraumatic.  Cardiovascular:     Rate and Rhythm: Normal rate.  Pulmonary:     Effort: Pulmonary effort is normal.  Neurological:     Mental Status: He is alert and oriented to person, place, and time.        Assessment & Plan:  [name] is a 63 y.o. male . Insomnia, unspecified type  -Improved with melatonin.  Continue same  Hypothyroidism, unspecified type - Plan: levothyroxine (SYNTHROID) 75 MCG tablet  - Stable, tolerating current regimen. Medications refilled. Labs pending as above.   Hyperlipidemia, unspecified hyperlipidemia type  -Declines statin at present, would like to try diet/exercise approach.  Low intensity exercise with walking most days per week discussed as well as minimizing fast food, healthier choices when eating fast food.   No orders of the defined types were placed in this encounter.  Patient Instructions   Glad to hear the sleep is better.  Continue melatonin.  Low intensity exercise with walking most days per week will help your cholesterol.  Also  be careful with fast food as can also work against your cholesterol levels.  We can recheck those levels in the next 6 months.  No changes for now.  Thank you for coming in today.    If you have lab work done today you will be contacted with your lab results within the next 2 weeks.  If you have not heard from [place] then please contact us. The fastest way to get your  results is to register for My Chart.   IF you received an x-ray today, you will receive an invoice from [facility]. Please contact [facility] Radiology at [phone] with questions or concerns regarding your invoice.   IF you received labwork today, you will receive an invoice from [place] [facility]. Please contact [facility] at [phone] with questions or concerns regarding your invoice.   Our billing staff will not be able to assist you with questions regarding bills from these companies.  You will be contacted with the lab results as soon as they are available. The fastest way to get your results is to activate your My Chart account. Instructions are located on the last page of this paperwork. If you have not heard from [place] regarding the results in 2 weeks, please contact this office.          Signed, [name], Clements [facility]

## 2019-07-11 NOTE — Patient Instructions (Addendum)
Glad to hear the sleep is better.  Continue melatonin.  Low intensity exercise with walking most days per week will help your cholesterol.  Also be careful with fast food as can also work against your cholesterol levels.  We can recheck those levels in the next 6 months.  No changes for now.  Thank you for coming in today.    If you have lab work done today you will be contacted with your lab results within the next 2 weeks.  If you have not heard from [place] then please contact us. The fastest way to get your results is to register for My Chart.   IF you received an x-ray today, you will receive an invoice from [facility]. Please contact [facility] Radiology at [phone] with questions or concerns regarding your invoice.   IF you received labwork today, you will receive an invoice from [facility] [place]. Please contact [facility] at [phone] with questions or concerns regarding your invoice.   Our billing staff will not be able to assist you with questions regarding bills from these companies.  You will be contacted with the lab results as soon as they are available. The fastest way to get your results is to activate your My Chart account. Instructions are located on the last page of this paperwork. If you have not heard from [place] regarding the results in 2 weeks, please contact this office.

## 2019-10-26 ENCOUNTER — Ambulatory Visit: Payer: Managed Care, Other (non HMO) | Attending: Internal Medicine

## 2019-10-26 DIAGNOSIS — Z23 Encounter for immunization: Secondary | ICD-10-CM

## 2019-10-26 NOTE — Progress Notes (Signed)
   Covid-19 Vaccination Clinic  Name:  Amato Piehl    MRN: [id number] DOB: December 09, 1955  10/26/2019  Mr. Sharpley was observed post Covid-19 immunization for 15 minutes without incident. He was provided with Vaccine Information Sheet and instruction to access the V-Safe system.   Mr. Crofoot was instructed to call 911 with any severe reactions post vaccine: Marland Kitchen Difficulty breathing  . Swelling of face and throat  . A fast heartbeat  . A bad rash all over body  . Dizziness and weakness   Immunizations Administered    Name Date Dose VIS Date Route   Pfizer COVID-19 Vaccine 10/26/2019  3:06 PM 0.3 mL 08/02/2019 Intramuscular   Manufacturer: [place]   Lot: [id number]   [place]: [id number]

## 2019-11-16 ENCOUNTER — Ambulatory Visit: Payer: Managed Care, Other (non HMO) | Attending: Internal Medicine

## 2019-11-16 DIAGNOSIS — Z23 Encounter for immunization: Secondary | ICD-10-CM

## 2019-11-16 NOTE — Progress Notes (Signed)
   Covid-19 Vaccination Clinic  Name:  Jeffery Clements    MRN: [id number] DOB: [birth year]  11/16/2019  [name] was observed post Covid-19 immunization for 15 minutes without incident. He was provided with Vaccine Information Sheet and instruction to access the V-Safe system.   [name] was instructed to call 911 with any severe reactions post vaccine: Marland Kitchen Difficulty breathing  . Swelling of face and throat  . A fast heartbeat  . A bad rash all over body  . Dizziness and weakness   Immunizations Administered    Name Date Dose VIS Date Route   Pfizer COVID-19 Vaccine 11/16/2019  3:56 PM 0.3 mL 08/02/2019 Intramuscular   Manufacturer: [place]   Lot: [id number]   [place]: [id number]

## 2020-01-09 ENCOUNTER — Other Ambulatory Visit: Payer: Self-pay

## 2020-01-09 ENCOUNTER — Encounter: Payer: Self-pay | Admitting: Family Medicine

## 2020-01-09 ENCOUNTER — Ambulatory Visit (INDEPENDENT_AMBULATORY_CARE_PROVIDER_SITE_OTHER): Payer: BC Managed Care – PPO | Admitting: Family Medicine

## 2020-01-09 VITALS — BP 119/81 | HR 72 | Temp 98.1°F | Ht 68.0 in | Wt 191.0 lb

## 2020-01-09 DIAGNOSIS — E039 Hypothyroidism, unspecified: Secondary | ICD-10-CM

## 2020-01-09 DIAGNOSIS — E785 Hyperlipidemia, unspecified: Secondary | ICD-10-CM

## 2020-01-09 MED ORDER — LEVOTHYROXINE SODIUM 75 MCG PO TABS: ORAL_TABLET | ORAL | 2 refills | Status: DC

## 2020-01-09 NOTE — Patient Instructions (Addendum)
Can try diet/exercise approach for elevated cholesterol.  Start low intensity annual/but goal of 150 minutes of exercise per week spread out over most days per week.  Recheck in 5 months for a physical.  No change in thyroid medication today. If any changes of the forearm area, follow-up with dermatology.  Some lightening may be due to use of steroid cream.       If you have lab work done today you will be contacted with your lab results within the next 2 weeks.  If you have not heard from [place] then please contact us. The fastest way to get your results is to register for My Chart.   IF you received an x-ray today, you will receive an invoice from [facility] Radiology. Please contact [facility] Radiology at [phone] with questions or concerns regarding your invoice.   IF you received labwork today, you will receive an invoice from [place]. Please contact [facility] at [phone] with questions or concerns regarding your invoice.   Our billing staff will not be able to assist you with questions regarding bills from these companies.  You will be contacted with the lab results as soon as they are available. The fastest way to get your results is to activate your My Chart account. Instructions are located on the last page of this paperwork. If you have not heard from [place] regarding the results in 2 weeks, please contact this office.

## 2020-01-09 NOTE — Progress Notes (Signed)
Subjective:  Patient ID: Jeffery Clements, male    DOB: March 10, 1956  Age: 64 y.o. MRN: [id number]  CC:  Chief Complaint  Patient presents with  . Hypothyroidism    6 month f/u     HPI Jeffery Clements presents for   Hyperlipidemia: Not on statin. Planned diet/exercise approach. No changes in diet/exercise.  Work is better - thinks may have more time for diet/exercise.  Weight up 1#.  The 10-year ASCVD risk score Jeffery Clements DC Brooke Bonito., et al., 2013) is: 11.7%   Values used to calculate the score:     Age: 89 years     Sex: Male     Is Non-Hispanic African American: No     Diabetic: No     Tobacco smoker: No     Systolic Blood Pressure: 627 mmHg     Is BP treated: No     HDL Cholesterol: 52 mg/dL     Total Cholesterol: 219 mg/dL  Wt Readings from Last 3 Encounters:  01/09/20 191 lb (86.6 kg)  07/11/19 190 lb 12.8 oz (86.5 kg)  05/30/19 188 lb 3.2 oz (85.4 kg)   Lab Results  Component Value Date   CHOL 219 (H) 05/30/2019   HDL 52 05/30/2019   LDLCALC 141 (H) 05/30/2019   TRIG 147 05/30/2019   CHOLHDL 4.2 05/30/2019   Lab Results  Component Value Date   ALT 32 05/30/2019   AST 27 05/30/2019   ALKPHOS 104 05/30/2019   BILITOT 1.1 05/30/2019   Hypothyroidism: Lab Results  Component Value Date   TSH 2.840 05/30/2019  Taking medication daily. Synthroid 84mg QD. No missed doses.  No new hot or cold intolerance. No new hair or skin changes, heart palpitations or new fatigue. No new weight changes.   Skin lesion: R forearm patch for years, lightening recently. Using triamcinolone at times. Not itching.  appt with derm in November.    History Patient Active Problem List   Diagnosis Date Noted  . Cough 08/21/2017  . Lower respiratory infection 08/21/2017  . Dyspnea on exertion 05/20/2016  . Sensorineural hearing loss (SNHL) 05/17/2016  . Subjective tinnitus of both ears 05/17/2016  . Hematuria 04/14/2015  . Hypertriglyceridemia 09/11/2012  . Rectal fissure 09/11/2012   . Colon polyps 09/11/2012   No past medical history on file. Past Surgical History:  Procedure Laterality Date  . VASECTOMY     No Known Allergies Prior to Admission medications   Medication Sig Start Date End Date Taking? Authorizing Provider  clobetasol cream (TEMOVATE) 00.35% Apply 1 application topically 2 (two) times daily. 04/07/14  Yes DDarlyne Russian MD  levothyroxine (SYNTHROID) 75 MCG tablet TAKE ONE TABLET BY MOUTH ONCE DAILY BEFORE  BREAKFAST 07/11/19  Yes Jeffery Agreste MD  Multiple Vitamins-Minerals (CENTRUM SILVER ADULT 50+) TABS Take by mouth.   Yes [provider]  Triamcinolone Acetonide (TRIAMCINOLONE 0.1 % CREAM : EUCERIN) CREA Apply 1 application topically 3 (three) times daily. 07/10/14  Yes DDarlyne Russian MD   Social History   Socioeconomic History  . Marital status: Divorced    Spouse name: Not on file  . Number of children: Not on file  . Years of education: Not on file  . Highest education level: Not on file  Occupational History  . Occupation: AAeronautical engineer [employer]  Tobacco Use  . Smoking status: Never Smoker  . Smokeless tobacco: Never Used  Substance and Sexual Activity  . Alcohol use: No  .  Drug use: No  . Sexual activity: Not on file  Other Topics Concern  . Not on file  Social History Narrative   Divorced. Education: [employer]. Exercise: Bouflex 3 times a week for 30 minutes.   Social Determinants of Health   Financial Resource Strain:   . Difficulty of Paying Living Expenses:   Food Insecurity:   . Worried About Charity fundraiser in the Last Year:   . Arboriculturist in the Last Year:   Transportation Needs:   . Film/video editor (Medical):   Marland Kitchen Lack of Transportation (Non-Medical):   Physical Activity:   . Days of Exercise per Week:   . Minutes of Exercise per Session:   Stress:   . Feeling of Stress :   Social Connections:   . Frequency of Communication with Friends and Family:   .  Frequency of Social Gatherings with Friends and Family:   . Attends Religious Services:   . Active Member of Clubs or Organizations:   . Attends Archivist Meetings:   Marland Kitchen Marital Status:   Intimate Partner Violence:   . Fear of Current or Ex-Partner:   . Emotionally Abused:   Marland Kitchen Physically Abused:   . Sexually Abused:     Review of Systems Per HPI.  Objective:   Vitals:   01/09/20 0857  BP: 119/81  Pulse: 72  Temp: 98.1 F (36.7 C)  SpO2: 94%  Weight: 191 lb (86.6 kg)  Height: _0  (1.727 m)     Physical Exam Vitals reviewed.  Constitutional:      Appearance: He is well-developed.  HENT:     Head: Normocephalic and atraumatic.  Eyes:     Pupils: Pupils are equal, round, and reactive to light.  Neck:     Vascular: No carotid bruit or JVD.     Comments: No thyromegaly/nodule.  Cardiovascular:     Rate and Rhythm: Normal rate and regular rhythm.     Heart sounds: Normal heart sounds. No murmur.  Pulmonary:     Effort: Pulmonary effort is normal.     Breath sounds: Normal breath sounds. No rales.  Skin:    General: Skin is warm and dry.     Comments: Patch R forearm - see photo.   Neurological:     General: No focal deficit present.     Mental Status: He is alert and oriented to person, place, and time.  Psychiatric:        Mood and Affect: Mood normal.        Behavior: Behavior normal.         Assessment & Plan:  Jeffery Clements is a 64 y.o. male . Hyperlipidemia, unspecified hyperlipidemia type - Plan: Lipid panel  -Discussed potential statin but he would like to try diet/exercise and with work changes may be in a better position to do so.  Recheck in 5 months with fasting labs.  Hypothyroidism, unspecified type - Plan: TSH, CMP14+EGFR  -No new symptoms, check TSH.  Continue same dose Synthroid.  Patch of right forearm, minimal lightening but may be due to use of topical steroid.  Follow-up with dermatology as planned, sooner if changes.    No orders of the defined types were placed in this encounter.  Patient Instructions       If you have lab work done today you will be contacted with your lab results within the next 2 weeks.  If you have not heard from [place] then please  contact us. The fastest way to get your results is to register for My Chart.   IF you received an x-ray today, you will receive an invoice from [facility] Radiology. Please contact [facility] Radiology at [phone] with questions or concerns regarding your invoice.   IF you received labwork today, you will receive an invoice from [place]. Please contact [facility] at [phone] with questions or concerns regarding your invoice.   Our billing staff will not be able to assist you with questions regarding bills from these companies.  You will be contacted with the lab results as soon as they are available. The fastest way to get your results is to activate your My Chart account. Instructions are located on the last page of this paperwork. If you have not heard from [place] regarding the results in 2 weeks, please contact this office.          Signed, [name], MD [facility]

## 2020-01-10 LAB — TSH: TSH: 3.75 u[IU]/mL (ref 0.450–4.500)

## 2020-06-10 ENCOUNTER — Ambulatory Visit: Payer: BC Managed Care – PPO | Admitting: Family Medicine

## 2020-06-10 ENCOUNTER — Encounter: Payer: Self-pay | Admitting: Family Medicine

## 2020-06-10 ENCOUNTER — Other Ambulatory Visit: Payer: Self-pay

## 2020-06-10 VITALS — BP 109/71 | HR 61 | Temp 98.1°F | Ht 68.0 in | Wt 189.0 lb

## 2020-06-10 DIAGNOSIS — E785 Hyperlipidemia, unspecified: Secondary | ICD-10-CM

## 2020-06-10 DIAGNOSIS — N400 Enlarged prostate without lower urinary tract symptoms: Secondary | ICD-10-CM | POA: Diagnosis not present

## 2020-06-10 DIAGNOSIS — M7581 Other shoulder lesions, right shoulder: Secondary | ICD-10-CM

## 2020-06-10 DIAGNOSIS — Z23 Encounter for immunization: Secondary | ICD-10-CM | POA: Diagnosis not present

## 2020-06-10 DIAGNOSIS — M25511 Pain in right shoulder: Secondary | ICD-10-CM | POA: Diagnosis not present

## 2020-06-10 DIAGNOSIS — E039 Hypothyroidism, unspecified: Secondary | ICD-10-CM | POA: Diagnosis not present

## 2020-06-10 MED ORDER — MELOXICAM 7.5 MG PO TABS: 7.5000 mg | ORAL_TABLET | Freq: Every day | ORAL | 0 refills | Status: DC

## 2020-06-10 NOTE — Progress Notes (Signed)
Subjective:  Patient ID: Jeffery Clements, male    DOB: 08-29-1955  Age: 64 y.o. MRN: [id number]  CC:  Chief Complaint  Patient presents with  . Hyperlipidemia    Pt reports no physical symptoms of this condtions.  . Shoulder Pain    Pt reports pain in his R shoulder. PT reports no known trigger for the pain, but the pain  comes and goes throughout the year with the change of the season.    HPI Jeffery Clements presents for   Hyperlipidemia: No current statin, diet/exercise approach. Weight improved few # with more salads.  Fasting today.  Wt Readings from Last 3 Encounters:  06/10/20 189 lb (85.7 kg)  01/09/20 191 lb (86.6 kg)  07/11/19 190 lb 12.8 oz (86.5 kg)   The 10-year ASCVD risk score Jeffery Clements DC Jr., et al., 2013) is: 10.1%   Values used to calculate the score:     Age: 76 years     Sex: Male     Is Non-Hispanic African American: No     Diabetic: No     Tobacco smoker: No     Systolic Blood Pressure: 505 mmHg     Is BP treated: No     HDL Cholesterol: 52 mg/dL     Total Cholesterol: 219 mg/dL  Lab Results  Component Value Date   CHOL 219 (H) 05/30/2019   HDL 52 05/30/2019   LDLCALC 141 (H) 05/30/2019   TRIG 147 05/30/2019   CHOLHDL 4.2 05/30/2019   Lab Results  Component Value Date   ALT 32 05/30/2019   AST 27 05/30/2019   ALKPHOS 104 05/30/2019   BILITOT 1.1 05/30/2019   Hypothyroidism: Lab Results  Component Value Date   TSH 3.750 01/09/2020  Taking medication daily.  Synthroid 75 mcg daily.  No new hot or cold intolerance. No new hair or skin changes, heart palpitations or new fatigue. No new weight changes.  Right shoulder pain Past 3-4 weeks. Not sure if slept on wrong. Sore at times in past, ok past 9 months. Rare tylenol. More persistent past few weeks. Some difficulty lifting - sore and difficult to lift.   R hand dominant.  No known injury. No neck pain.  No arm radiation.  Tx: heat at night, tylenol 1-2 per day.   XR R humerus  04/2018: IMPRESSION: Degenerative changes in the right AC joint. 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 No acute bony abnormality.  Has appt with urology in December. Hx of BPH, microscopic hematuria. Requests PSA today.   History Patient Active Problem List   Diagnosis Date Noted  . Cough 08/21/2017  . Lower respiratory infection 08/21/2017  . Dyspnea on exertion 05/20/2016  . Sensorineural hearing loss (SNHL) 05/17/2016  . Subjective tinnitus of both ears 05/17/2016  . Hematuria 04/14/2015  . Hypertriglyceridemia 09/11/2012  . Rectal fissure 09/11/2012  . Colon polyps 09/11/2012   No past medical history on file. Past Surgical History:  Procedure Laterality Date  . VASECTOMY     No Known Allergies Prior to Admission medications   Medication Sig Start Date End Date Taking? Authorizing Provider  clobetasol cream (TEMOVATE) 6.97 % Apply 1 application topically 2 (two) times daily. 04/07/14  Yes Jeffery Russian, MD  levothyroxine (SYNTHROID) 75 MCG tablet TAKE ONE TABLET BY MOUTH ONCE DAILY BEFORE  BREAKFAST 01/09/20  Yes Jeffery Agreste, MD  Multiple Vitamins-Minerals (CENTRUM SILVER ADULT 50+) TABS Take by mouth.   Yes [provider]  Triamcinolone  Acetonide (TRIAMCINOLONE 0.1 % CREAM : EUCERIN) CREA Apply 1 application topically 3 (three) times daily. 07/10/14  Yes Jeffery Russian, MD   Social History   Socioeconomic History  . Marital status: Divorced    Spouse name: Not on file  . Number of children: Not on file  . Years of education: Not on file  . Highest education level: Not on file  Occupational History  . Occupation: Aeronautical engineer: [employer]  Tobacco Use  . Smoking status: Never Smoker  . Smokeless tobacco: Never Used  Vaping Use  . Vaping Use: Never used  Substance and Sexual Activity  . Alcohol use: No  . Drug use: No  . Sexual activity: Not on file  Other Topics Concern  . Not on file  Social History  Narrative   Divorced. Education: [employer]. Exercise: [employer] 3 times a week for 30 minutes.   Social Determinants of Health   Financial Resource Strain:   . Difficulty of Paying Living Expenses: Not on file  Food Insecurity:   . Worried About Charity fundraiser in the Last Year: Not on file  . Ran Out of Food in the Last Year: Not on file  Transportation Needs:   . Lack of Transportation (Medical): Not on file  . Lack of Transportation (Non-Medical): Not on file  Physical Activity:   . Days of Exercise per Week: Not on file  . Minutes of Exercise per Session: Not on file  Stress:   . Feeling of Stress : Not on file  Social Connections:   . Frequency of Communication with Friends and Family: Not on file  . Frequency of Social Gatherings with Friends and Family: Not on file  . Attends Religious Services: Not on file  . Active Member of Clubs or Organizations: Not on file  . Attends Archivist Meetings: Not on file  . Marital Status: Not on file  Intimate Partner Violence:   . Fear of Current or Ex-Partner: Not on file  . Emotionally Abused: Not on file  . Physically Abused: Not on file  . Sexually Abused: Not on file    Review of Systems   Objective:   Vitals:   06/10/20 0959  BP: 109/71  Pulse: 61  Temp: 98.1 F (36.7 C)  TempSrc: Temporal  SpO2: 96%  Weight: 189 lb (85.7 kg)  Height: 5\' 8"  (1.727 m)     Physical Exam Vitals reviewed.  Constitutional:      Appearance: He is well-developed.  HENT:     Head: Normocephalic and atraumatic.  Eyes:     Pupils: Pupils are equal, round, and reactive to light.  Neck:     Vascular: No carotid bruit or JVD.  Cardiovascular:     Rate and Rhythm: Normal rate and regular rhythm.     Heart sounds: Normal heart sounds. No murmur heard.   Pulmonary:     Effort: Pulmonary effort is normal.     Breath sounds: Normal breath sounds. No rales.  Musculoskeletal:     Cervical back: No rigidity or tenderness.      Comments: C-spine, decreased range of motion with rotation, extension, but pain-free range of motion, no midline bony tenderness, motion does not cause shoulder pain  Right shoulder: No [place]/AC or other bony tenderness.  Full range of motion.  Full rotator cuff strength.  Slight discomfort with empty can testing.  Other RTC testing without discomfort.  Skin:    General: Skin  is warm and dry.  Neurological:     Mental Status: He is alert and oriented to person, place, and time.      Assessment & Plan:  Menelik Mcfarren is a 64 y.o. male . Right rotator cuff tendonitis - Plan: meloxicam (MOBIC) 7.5 MG tablet Pain in joint of right shoulder - Plan: meloxicam (MOBIC) 7.5 MG tablet  -New problem.  Appears to be rotator cuff tendinitis.  Previous x-ray was reviewed, chart review with previous calcific changes noted.  No known injury, imaging deferred.  Strength overall intact.  We will try meloxicam temporarily, recheck 3 to 4 weeks if not improving.  Consider injection, or Ortho eval.  Need for vaccination - Plan: Flu Vaccine QUAD High Dose(Fluad)  Hyperlipidemia, unspecified hyperlipidemia type - Plan: Comprehensive metabolic panel, Lipid panel  -Some weight loss noted.  Commended.  Check labs.  No new meds for now  Hypothyroidism, unspecified type - Plan: TSH  -  Stable, tolerating current regimen. Medications refilled. Labs pending as above.   Benign prostatic hyperplasia, unspecified whether lower urinary tract symptoms present - Plan: PSA  -Plan follow-up with urology, check PSA.  Meds ordered this encounter  Medications  . meloxicam (MOBIC) 7.5 MG tablet    Sig: Take 1 tablet (7.5 mg total) by mouth daily.    Dispense:  30 tablet    Refill:  0   Patient Instructions      No change in medication at this time.  I will check your cholesterol level as well as other lab work.  I did check a PSA today so that should be available for your appointment with urology.  Shoulder pain  does appear to be rotator cuff tendinitis or tendinosis.  Can try meloxicam 1 pill/day for the next week or 2 to see if that helps.  If no improvement in the next 3 to 4 weeks, please follow-up and we can discuss the next step in treatment or evaluation.  Follow-up sooner if worse.  If you have lab work done today you will be contacted with your lab results within the next 2 weeks.  If you have not heard from [place] then please contact us. The fastest way to get your results is to register for My Chart.   IF you received an x-ray today, you will receive an invoice from [facility]. Please contact [facility] at [phone] with questions or concerns regarding your invoice.   IF you received labwork today, you will receive an invoice from [place]. Please contact [facility] at [phone] with questions or concerns regarding your invoice.   Our billing staff will not be able to assist you with questions regarding bills from these companies.  You will be contacted with the lab results as soon as they are available. The fastest way to get your results is to activate your My Chart account. Instructions are located on the last page of this paperwork. If you have not heard from [place] regarding the results in 2 weeks, please contact this office.          Signed, [name], MD [facility]

## 2020-06-10 NOTE — Patient Instructions (Signed)
    No change in medication at this time.  I will check your cholesterol level as well as other lab work.  I did check a PSA today so that should be available for your appointment with urology.  Shoulder pain does appear to be rotator cuff tendinitis or tendinosis.  Can try meloxicam 1 pill/day for the next week or 2 to see if that helps.  If no improvement in the next 3 to 4 weeks, please follow-up and we can discuss the next step in treatment or evaluation.  Follow-up sooner if worse.  If you have lab work done today you will be contacted with your lab results within the next 2 weeks.  If you have not heard from [place] then please contact us. The fastest way to get your results is to register for My Chart.   IF you received an x-ray today, you will receive an invoice from [facility]. Please contact [facility]iology at [phone] with questions or concerns regarding your invoice.   IF you received labwork today, you will receive an invoice from [place]. Please contact [facility] at [phone] with questions or concerns regarding your invoice.   Our billing staff will not be able to assist you with questions regarding bills from these companies.  You will be contacted with the lab results as soon as they are available. The fastest way to get your results is to activate your My Chart account. Instructions are located on the last page of this paperwork. If you have not heard from [place] regarding the results in 2 weeks, please contact this office.

## 2020-06-11 LAB — COMPREHENSIVE METABOLIC PANEL
ALT: 25 IU/L (ref 0–44)
AST: 19 IU/L (ref 0–40)
Albumin/Globulin Ratio: 1.9 (ref 1.2–2.2)
Albumin: 4.7 g/dL (ref 3.8–4.8)
Alkaline Phosphatase: 100 IU/L (ref 44–121)
BUN/Creatinine Ratio: 13 (ref 10–24)
BUN: 12 mg/dL (ref 8–27)
Bilirubin Total: 0.8 mg/dL (ref 0.0–1.2)
CO2: 23 mmol/L (ref 20–29)
Calcium: 9.7 mg/dL (ref 8.6–10.2)
Chloride: 102 mmol/L (ref 96–106)
Creatinine, Ser: 0.92 mg/dL (ref 0.76–1.27)
GFR calc Af Amer: 101 mL/min/{1.73_m2} (ref >59)
GFR calc non Af Amer: 87 mL/min/{1.73_m2} (ref >59)
Globulin, Total: 2.5 g/dL (ref 1.5–4.5)
Glucose: 95 mg/dL (ref 65–99)
Potassium: 4.7 mmol/L (ref 3.5–5.2)
Sodium: 140 mmol/L (ref 134–144)
Total Protein: 7.2 g/dL (ref 6.0–8.5)

## 2020-06-11 LAB — LIPID PANEL
Chol/HDL Ratio: 3.7 ratio (ref 0.0–5.0)
Cholesterol, Total: 202 mg/dL — ABNORMAL HIGH (ref 100–199)
HDL: 54 mg/dL (ref >39)
LDL Chol Calc (NIH): 130 mg/dL — ABNORMAL HIGH (ref 0–99)
Triglycerides: 98 mg/dL (ref 0–149)
VLDL Cholesterol Cal: 18 mg/dL (ref 5–40)

## 2020-06-11 LAB — PSA: Prostate Specific Ag, Serum: 0.7 ng/mL (ref 0.0–4.0)

## 2020-06-11 LAB — TSH: TSH: 2.71 u[IU]/mL (ref 0.450–4.500)

## 2020-07-09 ENCOUNTER — Ambulatory Visit (INDEPENDENT_AMBULATORY_CARE_PROVIDER_SITE_OTHER): Payer: BC Managed Care – PPO | Admitting: Family Medicine

## 2020-07-09 ENCOUNTER — Other Ambulatory Visit: Payer: Self-pay

## 2020-07-09 ENCOUNTER — Encounter: Payer: Self-pay | Admitting: Family Medicine

## 2020-07-09 VITALS — BP 116/78 | HR 80 | Temp 98.1°F | Resp 15 | Ht 68.0 in | Wt 190.2 lb

## 2020-07-09 DIAGNOSIS — E785 Hyperlipidemia, unspecified: Secondary | ICD-10-CM

## 2020-07-09 DIAGNOSIS — Z0001 Encounter for general adult medical examination with abnormal findings: Secondary | ICD-10-CM

## 2020-07-09 DIAGNOSIS — L821 Other seborrheic keratosis: Secondary | ICD-10-CM | POA: Diagnosis not present

## 2020-07-09 DIAGNOSIS — E039 Hypothyroidism, unspecified: Secondary | ICD-10-CM

## 2020-07-09 DIAGNOSIS — D225 Melanocytic nevi of trunk: Secondary | ICD-10-CM | POA: Diagnosis not present

## 2020-07-09 DIAGNOSIS — D1801 Hemangioma of skin and subcutaneous tissue: Secondary | ICD-10-CM | POA: Diagnosis not present

## 2020-07-09 DIAGNOSIS — Z0000 Encounter for general adult medical examination without abnormal findings: Secondary | ICD-10-CM

## 2020-07-09 DIAGNOSIS — L814 Other melanin hyperpigmentation: Secondary | ICD-10-CM | POA: Diagnosis not present

## 2020-07-09 NOTE — Patient Instructions (Addendum)
  Continue your current levothyroxine dose.  Continue using the clobetasol cream on a as needed basis on the skin rashes intermittently.  Use the meloxicam that he gave you for your shoulder as needed.  The other medicine I suggested to consider is the over-the-counter topical use of diclofenac gel (Voltaren gel) that can be used 2 or 3 times a day for painful joints.  Mentally start preparing yourself for what you will do with your time in the coming years when you retire.  Your laboratory testing last month looked good.  I do not think there is anything major you can do to change your risk factors based on these numbers.  I do advise you to get more regular exercise.  The American Heart Association recommends 30 minutes 5 days a week of an active exercise.  Remember to continue to seek to be physically, emotionally, relationally, and spiritually healthy as discussed.  Return to see Dr. Carlota Raspberry or one of the rest of [place] as needed.   If you have lab work done today you will be contacted with your lab results within the next 2 weeks.  If you have not heard from [place] then please contact us. The fastest way to get your results is to register for My Chart.   IF you received an x-ray today, you will receive an invoice from [facility]. Please contact [facility] Radiology at [phone] with questions or concerns regarding your invoice.   IF you received labwork today, you will receive an invoice from [place]. Please contact [facility] at [phone] with questions or concerns regarding your invoice.   Our billing staff will not be able to assist you with questions regarding bills from these companies.  You will be contacted with the lab results as soon as they are available. The fastest way to get your results is to activate your My Chart account. Instructions are located on the last page of this paperwork. If you have not heard from [place] regarding the results in 2 weeks, please contact this  office.

## 2020-07-09 NOTE — Progress Notes (Signed)
Patient ID: Jeffery Clements, male    DOB: 22-May-1956  Age: 64 y.o. MRN: [id number]  Chief Complaint  Patient presents with  . Annual Exam    pt here for physical no quetions or concerns would like to review lab work     Subjective:   Patient is here for his annual physical examination.  He has no major acute concerns.  He was here a month ago and saw Dr. Carlota Raspberry who got his physical labs and already replied regarding them.  He sees a dermatologist periodically for a skin check.  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  Past medical history: Generally has been in good health. Sees a urologist to follow his prostate. Major illnesses: None.  Has been on thyroid medicine for a number of years Surgeries: Medications: Levothyroxine, meloxicam, as needed clobetasol Allergies: None known  Family history: Father died at 25 of old age Mother died in her 53s with pulmonary fibrosis He has 4 children, 3 daughters and 1 son.  7 grandchildren and a great grandchild on the way. 4 siblings living and well.   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  Divorced.  Has a son's friend who lives with him some of the time and takes care of his dogs.  The patient has children scattered around is going to [place] for Thanksgiving tomorrow with his daughter.  Not much of a spiritual component to his life.  Drinks about 1 beer a month if that.  Does not use drugs.  Does not smoke.  Review of systems: Constitutional: Unremarkable.  Does not get much exercise. HEENT: Wears glasses, and as needed hearing aids. Cardiovascular: Unremarkable Respiratory: Unremarkable GI: Unremarkable GU: Unremarkable Musculoskeletal: Unremarkable Dermatologic: Unremarkable Neurologic: Unremarkable Psychiatric: Unremarkable Endocrinologic: Unremarkable   Current allergies, medications, problem list, past/family and social histories  reviewed.  Objective:  BP 116/78   Pulse 80   Temp 98.1 F (36.7 C) (Temporal)   Resp 15   Ht 5\' 8"  (1.727 m)   Wt 190 lb 3.2 oz (86.3 kg)   SpO2 97%   BMI 28.92 kg/m   Healthy-appearing man in no acute distress.  TMs normal.  Eyes PERRL.  Throat clear.  Neck supple without nodes or thyromegaly.  No carotid bruits.  Chest is clear to auscultation.  Heart rate without murmurs, gallops, or arrhythmias noted.  Abdomen soft without mass or tenderness.  Normal male external genitalia with testes descended.  A little bit of prominence of the vascular structure in the right scrotum, possible small varicocele tissue.  Extremities unremarkable.  Skin normal.  Small scar on his left forehead where he had the best cell skin cancer removed in the past.  Assessment & Plan:   Assessment: 1. Annual physical exam   2. Hypothyroidism, unspecified type   3. Hyperlipidemia, unspecified hyperlipidemia type       Plan: See instructions.  This was a normal physical exam.  No orders of the defined types were placed in this encounter.   No orders of the defined types were placed in this encounter.        Patient Instructions    Continue your current levothyroxine dose.  Continue using the clobetasol cream on a as needed basis on the skin rashes intermittently.  Use the meloxicam that he gave you for your shoulder as needed.  The other medicine I suggested to consider is the over-the-counter topical  use of diclofenac gel (Voltaren gel) that can be used 2 or 3 times a day for painful joints.  Mentally start preparing yourself for what you will do with your time in the coming years when you retire.  Your laboratory testing last month looked good.  I do not think there is anything major you can do to change your risk factors based on these numbers.  I do advise you to get more regular exercise.  The American Heart Association recommends 30 minutes 5 days a week of an active  exercise.  Remember to continue to seek to be physically, emotionally, relationally, and spiritually healthy as discussed.  Return to see Dr. Carlota Raspberry or one of the rest of [place] as needed.   If you have lab work done today you will be contacted with your lab results within the next 2 weeks.  If you have not heard from [place] then please contact us. The fastest way to get your results is to register for My Chart.   IF you received an x-ray today, you will receive an invoice from [facility] Radiology. Please contact [facility] Radiology at [phone] with questions or concerns regarding your invoice.   IF you received labwork today, you will receive an invoice from [place]. Please contact [facility] at [phone] with questions or concerns regarding your invoice.   Our billing staff will not be able to assist you with questions regarding bills from these companies.  You will be contacted with the lab results as soon as they are available. The fastest way to get your results is to activate your My Chart account. Instructions are located on the last page of this paperwork. If you have not heard from [place] regarding the results in 2 weeks, please contact this office.        Return in about 1 year (around 07/09/2021), or Dr. Carlota Raspberry, for Annual physical.   Ruben Reason, MD 07/09/2020

## 2020-07-27 DIAGNOSIS — Z20822 Contact with and (suspected) exposure to covid-19: Secondary | ICD-10-CM | POA: Diagnosis not present

## 2020-08-27 DIAGNOSIS — R8271 Bacteriuria: Secondary | ICD-10-CM | POA: Diagnosis not present

## 2020-08-27 DIAGNOSIS — N400 Enlarged prostate without lower urinary tract symptoms: Secondary | ICD-10-CM | POA: Diagnosis not present

## 2020-08-27 DIAGNOSIS — R311 Benign essential microscopic hematuria: Secondary | ICD-10-CM | POA: Diagnosis not present

## 2020-10-09 DIAGNOSIS — Z20822 Contact with and (suspected) exposure to covid-19: Secondary | ICD-10-CM | POA: Diagnosis not present

## 2020-12-09 ENCOUNTER — Ambulatory Visit: Payer: Self-pay | Admitting: Family Medicine

## 2020-12-25 DIAGNOSIS — Z20822 Contact with and (suspected) exposure to covid-19: Secondary | ICD-10-CM | POA: Diagnosis not present

## 2021-01-15 ENCOUNTER — Telehealth: Payer: Self-pay

## 2021-01-15 DIAGNOSIS — E039 Hypothyroidism, unspecified: Secondary | ICD-10-CM

## 2021-01-15 NOTE — Telephone Encounter (Signed)
Patient requesting levothyroxine be refilled. Last seen in October 2021 but is scheduled for 6/27 for follow up. Please advise if med can be refilled

## 2021-01-19 ENCOUNTER — Other Ambulatory Visit: Payer: Self-pay

## 2021-01-19 DIAGNOSIS — E039 Hypothyroidism, unspecified: Secondary | ICD-10-CM

## 2021-01-19 MED ORDER — LEVOTHYROXINE SODIUM 75 MCG PO TABS: ORAL_TABLET | ORAL | 0 refills | Status: DC

## 2021-01-19 NOTE — Telephone Encounter (Signed)
Done today for 30 days

## 2021-02-15 ENCOUNTER — Other Ambulatory Visit: Payer: Self-pay

## 2021-02-15 ENCOUNTER — Ambulatory Visit: Payer: BC Managed Care – PPO | Admitting: Family Medicine

## 2021-02-15 VITALS — BP 128/74 | HR 67 | Temp 98.2°F | Resp 16 | Ht 68.0 in | Wt 186.4 lb

## 2021-02-15 DIAGNOSIS — Z6828 Body mass index (BMI) 28.0-28.9, adult: Secondary | ICD-10-CM

## 2021-02-15 DIAGNOSIS — Z23 Encounter for immunization: Secondary | ICD-10-CM | POA: Diagnosis not present

## 2021-02-15 DIAGNOSIS — E039 Hypothyroidism, unspecified: Secondary | ICD-10-CM | POA: Diagnosis not present

## 2021-02-15 DIAGNOSIS — E785 Hyperlipidemia, unspecified: Secondary | ICD-10-CM

## 2021-02-15 LAB — COMPREHENSIVE METABOLIC PANEL
ALT: 18 U/L (ref 0–53)
AST: 19 U/L (ref 0–37)
Albumin: 4.8 g/dL (ref 3.5–5.2)
Alkaline Phosphatase: 98 U/L (ref 39–117)
BUN: 18 mg/dL (ref 6–23)
CO2: 27 mEq/L (ref 19–32)
Calcium: 10 mg/dL (ref 8.4–10.5)
Chloride: 100 mEq/L (ref 96–112)
Creatinine, Ser: 0.87 mg/dL (ref 0.40–1.50)
GFR: 89.91 mL/min (ref >60.00)
Glucose, Bld: 81 mg/dL (ref 70–99)
Potassium: 4.1 mEq/L (ref 3.5–5.1)
Sodium: 139 mEq/L (ref 135–145)
Total Bilirubin: 1.1 mg/dL (ref 0.2–1.2)
Total Protein: 7.7 g/dL (ref 6.0–8.3)

## 2021-02-15 LAB — LIPID PANEL
Cholesterol: 225 mg/dL — ABNORMAL HIGH (ref 0–200)
HDL: 54.8 mg/dL (ref >39.00)
LDL Cholesterol: 138 mg/dL — ABNORMAL HIGH (ref 0–99)
NonHDL: 170.51
Total CHOL/HDL Ratio: 4
Triglycerides: 162 mg/dL — ABNORMAL HIGH (ref 0.0–149.0)
VLDL: 32.4 mg/dL (ref 0.0–40.0)

## 2021-02-15 LAB — TSH: TSH: 3.55 u[IU]/mL (ref 0.35–4.50)

## 2021-02-15 MED ORDER — LEVOTHYROXINE SODIUM 75 MCG PO TABS: ORAL_TABLET | ORAL | 2 refills | Status: DC

## 2021-02-15 NOTE — Progress Notes (Signed)
Subjective:  Patient ID: Jeffery Clements, male    DOB: 02/18/1956  Age: 65 y.o. MRN: [id number]  CC:  Chief Complaint  Patient presents with   Hypothyroidism    Recheck levels today, refill medications   Hyperlipidemia    Recheck levels, no medication at this time     HPI Jeffery Clements presents for   Hyperlipidemia: No current statin, diet/exercise approach Lab Results  Component Value Date   CHOL 225 (H) 02/15/2021   HDL 54.80 02/15/2021   LDLCALC 138 (H) 02/15/2021   TRIG 162.0 (H) 02/15/2021   CHOLHDL 4 02/15/2021   Lab Results  Component Value Date   ALT 18 02/15/2021   AST 19 02/15/2021   ALKPHOS 98 02/15/2021   BILITOT 1.1 02/15/2021   Hypothyroidism: Lab Results  Component Value Date   TSH 3.55 02/15/2021  Taking medication daily.  75 mcg Synthroid No new hot or cold intolerance. No new hair or skin changes, heart palpitations or new fatigue. No new weight changes.   Followed by urology for history of BPH, microscopic hematuria, last visit in January with planned 1 year follow-up with renal ultrasound/office visit  Health maintenance Shingles vaccine - completed.  Pneumonia vaccine - today.  COVID-vaccine, primary series in March 2021, booster- had first, recommended second.   Body mass index is 28.34 kg/m. Wt Readings from Last 3 Encounters:  02/15/21 186 lb 6.4 oz (84.6 kg)  07/09/20 190 lb 3.2 oz (86.3 kg)  06/10/20 189 lb (85.7 kg)  Fast food 3-4 times per week. Pepsi 1-2 per day.        History Patient Active Problem List   Diagnosis Date Noted   Cough 08/21/2017   Lower respiratory infection 08/21/2017   Dyspnea on exertion 05/20/2016   Sensorineural hearing loss (SNHL) 05/17/2016   Subjective tinnitus of both ears 05/17/2016   Hematuria 04/14/2015   Hypertriglyceridemia 09/11/2012   Rectal fissure 09/11/2012   Colon polyps 09/11/2012   No past medical history on file. Past Surgical History:  Procedure Laterality Date    VASECTOMY     No Known Allergies Prior to Admission medications   Medication Sig Start Date End Date Taking? Authorizing Provider  clobetasol cream (TEMOVATE) 9.38 % Apply 1 application topically 2 (two) times daily. 04/07/14  Yes Darlyne Russian, MD  levothyroxine (SYNTHROID) 75 MCG tablet TAKE ONE TABLET BY MOUTH ONCE DAILY BEFORE  BREAKFAST 01/19/21  Yes Wendie Agreste, MD  meloxicam (MOBIC) 7.5 MG tablet Take 1 tablet (7.5 mg total) by mouth daily. 06/10/20  Yes Wendie Agreste, MD  Multiple Vitamins-Minerals (CENTRUM SILVER ADULT 50+) TABS Take by mouth.   Yes [provider]  Triamcinolone Acetonide (TRIAMCINOLONE 0.1 % CREAM : EUCERIN) CREA Apply 1 application topically 3 (three) times daily. 07/10/14  Yes Daub, Loura Back, MD   Social History   Socioeconomic History   Marital status: Divorced    Spouse name: Not on file   Number of children: Not on file   Years of education: Not on file   Highest education level: Not on file  Occupational History   Occupation: Accountant    Employer: [employer]  Tobacco Use   Smoking status: Never   Smokeless tobacco: Never  Vaping Use   Vaping Use: Never used  Substance and Sexual Activity   Alcohol use: No   Drug use: No   Sexual activity: Not Currently  Other Topics Concern   Not on file  Social History Narrative  Divorced. Education: [employer]. Exercise: [employer] 3 times a week for 30 minutes.   Social Determinants of Health   Financial Resource Strain: Not on file  Food Insecurity: Not on file  Transportation Needs: Not on file  Physical Activity: Not on file  Stress: Not on file  Social Connections: Not on file  Intimate Partner Violence: Not on file    Review of Systems  Constitutional:  Negative for fatigue and unexpected weight change.  Eyes:  Negative for visual disturbance.  Respiratory:  Negative for cough, chest tightness and shortness of breath.   Cardiovascular:  Negative for chest pain,  palpitations and leg swelling.  Gastrointestinal:  Negative for abdominal pain and blood in stool.  Neurological:  Negative for dizziness, light-headedness and headaches.    Objective:   Vitals:   02/15/21 1327  BP: 128/74  Pulse: 67  Resp: 16  Temp: 98.2 F (36.8 C)  TempSrc: Temporal  SpO2: 98%  Weight: 186 lb 6.4 oz (84.6 kg)  Height: 5\' 8"  (1.727 m)     Physical Exam Vitals reviewed.  Constitutional:      Appearance: He is well-developed.  HENT:     Head: Normocephalic and atraumatic.  Neck:     Vascular: No carotid bruit or JVD.     Comments: No nodules, ttp.  Cardiovascular:     Rate and Rhythm: Normal rate and regular rhythm.     Heart sounds: Normal heart sounds. No murmur heard. Pulmonary:     Effort: Pulmonary effort is normal.     Breath sounds: Normal breath sounds. No rales.  Musculoskeletal:     Cervical back: Neck supple.     Right lower leg: No edema.     Left lower leg: No edema.  Skin:    General: Skin is warm and dry.  Neurological:     Mental Status: He is alert and oriented to person, place, and time.  Psychiatric:        Mood and Affect: Mood normal.       Assessment & Plan:  Jeffery Clements is a 65 y.o. male . Hyperlipidemia, unspecified hyperlipidemia type - Plan: Lipid panel, Comprehensive metabolic panel  -Check labs, diet/exercise discussed with avoidance of sugar containing beverages, fast food, and increased activity.  Start low/go slow approach discussed with goal of 150 minutes/week eventually.  Hypothyroidism, unspecified type - Plan: levothyroxine (SYNTHROID) 75 MCG tablet, TSH, Comprehensive metabolic panel  -Check labs, clinically stable, continue Synthroid for now same dose.  Need for vaccination against Streptococcus pneumoniae - Plan: Pneumococcal conjugate vaccine 20-valent (Prevnar 20) given.  Also recommended COVID booster  BMI 28.0-28.9,adult  -As above lifestyle modification discussed with increased  activity/exercise, avoidance of sugar containing beverages and fast food   Meds ordered this encounter  Medications   levothyroxine (SYNTHROID) 75 MCG tablet    Sig: TAKE ONE TABLET BY MOUTH ONCE DAILY BEFORE  BREAKFAST    Dispense:  90 tablet    Refill:  2   Patient Instructions  I will check labs, but no med changes for now.  Try to cut back on fast food and sodas by half, exercise initially 2 days per week for 15 minutes. Increase exercise as able to goal of 116minutes per week. Pneumonia vaccine given, but I do recommend 2nd covid vaccien booster if over 4 months since prior vaccine.  Thanks for coming in today.     Signed,   [name], MD [place] 02/16/21  3:58 PM

## 2021-02-15 NOTE — Patient Instructions (Addendum)
I will check labs, but no med changes for now.  Try to cut back on fast food and sodas by half, exercise initially 2 days per week for 15 minutes. Increase exercise as able to goal of 142minutes per week. Pneumonia vaccine given, but I do recommend 2nd covid vaccien booster if over 4 months since prior vaccine.  Thanks for coming in today.

## 2021-06-14 ENCOUNTER — Encounter: Payer: Self-pay | Admitting: Family Medicine

## 2021-06-14 DIAGNOSIS — Z23 Encounter for immunization: Secondary | ICD-10-CM | POA: Diagnosis not present

## 2021-07-07 DIAGNOSIS — D225 Melanocytic nevi of trunk: Secondary | ICD-10-CM | POA: Diagnosis not present

## 2021-07-07 DIAGNOSIS — L570 Actinic keratosis: Secondary | ICD-10-CM | POA: Diagnosis not present

## 2021-07-07 DIAGNOSIS — L821 Other seborrheic keratosis: Secondary | ICD-10-CM | POA: Diagnosis not present

## 2021-07-07 DIAGNOSIS — L814 Other melanin hyperpigmentation: Secondary | ICD-10-CM | POA: Diagnosis not present

## 2021-07-07 DIAGNOSIS — D485 Neoplasm of uncertain behavior of skin: Secondary | ICD-10-CM | POA: Diagnosis not present

## 2021-07-21 ENCOUNTER — Ambulatory Visit (INDEPENDENT_AMBULATORY_CARE_PROVIDER_SITE_OTHER): Payer: BC Managed Care – PPO | Admitting: Family Medicine

## 2021-07-21 ENCOUNTER — Encounter: Payer: Self-pay | Admitting: Family Medicine

## 2021-07-21 VITALS — BP 108/70 | HR 71 | Temp 98.2°F | Resp 16 | Ht 68.0 in | Wt 193.6 lb

## 2021-07-21 DIAGNOSIS — M7701 Medial epicondylitis, right elbow: Secondary | ICD-10-CM

## 2021-07-21 DIAGNOSIS — Z130 Encounter for screening for diseases of the blood and blood-forming organs and certain disorders involving the immune mechanism: Secondary | ICD-10-CM

## 2021-07-21 DIAGNOSIS — M25511 Pain in right shoulder: Secondary | ICD-10-CM

## 2021-07-21 DIAGNOSIS — Z131 Encounter for screening for diabetes mellitus: Secondary | ICD-10-CM

## 2021-07-21 DIAGNOSIS — G4700 Insomnia, unspecified: Secondary | ICD-10-CM

## 2021-07-21 DIAGNOSIS — M7702 Medial epicondylitis, left elbow: Secondary | ICD-10-CM

## 2021-07-21 DIAGNOSIS — E785 Hyperlipidemia, unspecified: Secondary | ICD-10-CM | POA: Diagnosis not present

## 2021-07-21 DIAGNOSIS — Z0000 Encounter for general adult medical examination without abnormal findings: Secondary | ICD-10-CM

## 2021-07-21 DIAGNOSIS — Z125 Encounter for screening for malignant neoplasm of prostate: Secondary | ICD-10-CM | POA: Diagnosis not present

## 2021-07-21 DIAGNOSIS — M7581 Other shoulder lesions, right shoulder: Secondary | ICD-10-CM | POA: Diagnosis not present

## 2021-07-21 DIAGNOSIS — E039 Hypothyroidism, unspecified: Secondary | ICD-10-CM

## 2021-07-21 LAB — COMPREHENSIVE METABOLIC PANEL
ALT: 27 U/L (ref 0–53)
AST: 20 U/L (ref 0–37)
Albumin: 4.5 g/dL (ref 3.5–5.2)
Alkaline Phosphatase: 94 U/L (ref 39–117)
BUN: 13 mg/dL (ref 6–23)
CO2: 29 mEq/L (ref 19–32)
Calcium: 9.7 mg/dL (ref 8.4–10.5)
Chloride: 101 mEq/L (ref 96–112)
Creatinine, Ser: 0.92 mg/dL (ref 0.40–1.50)
GFR: 86.42 mL/min (ref >60.00)
Glucose, Bld: 94 mg/dL (ref 70–99)
Potassium: 4.8 mEq/L (ref 3.5–5.1)
Sodium: 137 mEq/L (ref 135–145)
Total Bilirubin: 1.2 mg/dL (ref 0.2–1.2)
Total Protein: 7.4 g/dL (ref 6.0–8.3)

## 2021-07-21 LAB — CBC WITH DIFFERENTIAL/PLATELET
Basophils Absolute: 0.1 10*3/uL (ref 0.0–0.1)
Basophils Relative: 0.9 % (ref 0.0–3.0)
Eosinophils Absolute: 0.2 10*3/uL (ref 0.0–0.7)
Eosinophils Relative: 3 % (ref 0.0–5.0)
HCT: 45 % (ref 39.0–52.0)
Hemoglobin: 14.8 g/dL (ref 13.0–17.0)
Lymphocytes Relative: 27.2 % (ref 12.0–46.0)
Lymphs Abs: 2 10*3/uL (ref 0.7–4.0)
MCHC: 32.8 g/dL (ref 30.0–36.0)
MCV: 88 fl (ref 78.0–100.0)
Monocytes Absolute: 0.7 10*3/uL (ref 0.1–1.0)
Monocytes Relative: 9.7 % (ref 3.0–12.0)
Neutro Abs: 4.4 10*3/uL (ref 1.4–7.7)
Neutrophils Relative %: 59.2 % (ref 43.0–77.0)
Platelets: 287 10*3/uL (ref 150.0–400.0)
RBC: 5.12 Mil/uL (ref 4.22–5.81)
RDW: 12.8 % (ref 11.5–15.5)
WBC: 7.5 10*3/uL (ref 4.0–10.5)

## 2021-07-21 LAB — LIPID PANEL
Cholesterol: 209 mg/dL — ABNORMAL HIGH (ref 0–200)
HDL: 56.4 mg/dL (ref >39.00)
LDL Cholesterol: 129 mg/dL — ABNORMAL HIGH (ref 0–99)
NonHDL: 152.63
Total CHOL/HDL Ratio: 4
Triglycerides: 118 mg/dL (ref 0.0–149.0)
VLDL: 23.6 mg/dL (ref 0.0–40.0)

## 2021-07-21 LAB — TSH: TSH: 4.85 u[IU]/mL (ref 0.35–5.50)

## 2021-07-21 LAB — PSA: PSA: 0.64 ng/mL (ref 0.10–4.00)

## 2021-07-21 LAB — HEMOGLOBIN A1C: Hgb A1c MFr Bld: 6.3 % (ref 4.6–6.5)

## 2021-07-21 MED ORDER — LEVOTHYROXINE SODIUM 75 MCG PO TABS: ORAL_TABLET | ORAL | 2 refills | Status: DC

## 2021-07-21 MED ORDER — MELOXICAM 7.5 MG PO TABS: 7.5000 mg | ORAL_TABLET | Freq: Every day | ORAL | 1 refills | Status: DC

## 2021-07-21 NOTE — Progress Notes (Signed)
Subjective:  Patient ID: Jeffery Clements, male    DOB: [birth year]  Age: 65 y.o. MRN: [id number]  CC:  Chief Complaint  Patient presents with   Medicare Wellness    Pt here for annual exam no concerns feels well     HPI Jeffery Clements presents for   Presents for annual wellness exam.   Care team: PCP: me Dermatology: [facility], recent biopsy irritated nevus. Lentigo? No CA. Prior hx of basal cell CA.  Urology, Dr. Lovena Neighbours.  BPH.  History of microscopic hematuria.  Follow-up planned early next year for recheck renal ultrasound.  Hypothyroidism: Lab Results  Component Value Date   TSH 3.55 02/15/2021  75 mcg Synthroid daily. No new hot or cold intolerance. No new hair or skin changes, heart palpitations or new fatigue.  Elbow pain: Noted in left more than right. Noticed after moving some boxes. Past few months on left, R side soreness off and on.  Tx: none. Not limiting activities, intermittent pain.  Lateral epicondylitis in 2015.   Hyperlipidemia: Diet/exercise approach.  Weight has increased since June. Same diet, does have Bowlfex - plan to increase exercise and Bowflex diet. Has helped diet.  Wt Readings from Last 3 Encounters:  07/21/21 193 lb 9.6 oz (87.8 kg)  02/15/21 186 lb 6.4 oz (84.6 kg)  07/09/20 190 lb 3.2 oz (86.3 kg)    Lab Results  Component Value Date   CHOL 225 (H) 02/15/2021   HDL 54.80 02/15/2021   LDLCALC 138 (H) 02/15/2021   TRIG 162.0 (H) 02/15/2021   CHOLHDL 4 02/15/2021   Lab Results  Component Value Date   ALT 18 02/15/2021   AST 19 02/15/2021   ALKPHOS 98 02/15/2021   BILITOT 1.1 02/15/2021    Fall screening Fall Risk  07/21/2021 07/09/2020 06/10/2020 01/09/2020 07/11/2019  Falls in the past year? 0 0 0 0 0  Number falls in past yr: 0 0 - 0 0  Injury with Fall? 0 0 - 0 0  Risk for fall due to : No Fall Risks No Fall Risks - - -  Follow up Falls evaluation completed Falls evaluation completed Falls evaluation  completed Falls evaluation completed Falls evaluation completed   Lighting in home: adequate Loose rugs/carpets/pets:none Stairs:few into home. Single level home.  Grab bars in bathroom:none.  Timed up and go:8 seconds, normal gait, no instability.   Depression Screening: Depression screen [facility] 2/9 07/21/2021 02/15/2021 07/09/2020 06/10/2020 01/09/2020  Decreased Interest 0 0 0 0 0  Down, Depressed, Hopeless 0 0 0 0 0  PHQ - 2 Score 0 0 0 0 0  Altered sleeping 1 0 - - -  Tired, decreased energy 0 0 - - -  Change in appetite 1 0 - - -  Feeling bad or failure about yourself  0 0 - - -  Trouble concentrating 0 0 - - -  Moving slowly or fidgety/restless 0 0 - - -  Suicidal thoughts 0 0 - - -  PHQ-9 Score 2 0 - - -  Some trouble getting to sleep - uses melatonin. Late bedtime, sometimes wakes up early. Some in past 3 months. No anxiety/worry.  No PND, or orthopnea.  Cancer Screening: Colonoscopy 08/03/2017.  Tubular adenoma, repeat 5-7 years.  Lab Results  Component Value Date   PSA1 0.7 06/10/2020   PSA1 0.7 05/30/2019   PSA 0.5 04/26/2016   PSA 1.46 04/14/2015   PSA 2.09 04/01/2015   The natural history of prostate cancer and  ongoing controversy regarding screening and potential treatment outcomes of prostate cancer has been discussed with the patient. The meaning of a false positive PSA and a false negative PSA has been discussed. He indicates understanding of the limitations of this screening test and wishes to proceed with screening PSA testing. Ongoing follow-up with dermatology with history of basal cell skin cancer.  Immunization History  Administered Date(s) Administered   Fluad Quad(high Dose 65+) 06/10/2020   Influenza,inj,Quad PF,6+ Mos 05/09/2018, 05/30/2019   Influenza-Unspecified 05/21/2014   PFIZER(Purple Top)SARS-COV-2 Vaccination 10/26/2019, 11/16/2019   PNEUMOCOCCAL CONJUGATE-20 02/15/2021   Tdap 02/15/2012   Zoster Recombinat (Shingrix) 07/06/2019   Zoster,  Live 03/22/2014  Flu vaccine and covid booster in October.   Functional Status Survey: Is the patient deaf or have difficulty hearing?: No Does the patient have difficulty seeing, even when wearing glasses/contacts?: No Does the patient have difficulty concentrating, remembering, or making decisions?: No Does the patient have difficulty walking or climbing stairs?: No Does the patient have difficulty dressing or bathing?: No Does the patient have difficulty doing errands alone such as visiting a doctor's office or shopping?: No  Memory Screen: 6CIT Screen 07/21/2021 07/09/2020  What Year? 0 points 0 points  What month? 0 points 0 points  What time? 0 points 0 points  Count back from 20 0 points 0 points  Months in reverse 0 points 0 points  Repeat phrase 0 points 0 points  Total Score 0 0    Alcohol Screening: [place] Office Visit from 07/21/2021 in [place]  AUDIT-C Score 0       Tobacco: None.   No results found. Optho/optometry: wears glasses - overdue for visit.   Dental: Every 6 months.   Exercise: Minimal walking.   Advanced Directives:  Info given.    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 No pertinent past medical history. Past Surgical History:  Procedure Laterality Date   VASECTOMY     No Known Allergies Prior to Admission medications   Medication Sig Start Date End Date Taking? Authorizing Provider  clobetasol cream (TEMOVATE) 5.36 % Apply 1 application topically 2 (two) times daily. 04/07/14  Yes Darlyne Russian, MD  levothyroxine (SYNTHROID) 75 MCG tablet TAKE  ONE TABLET BY MOUTH ONCE DAILY BEFORE  BREAKFAST 02/15/21  Yes Wendie Agreste, MD  Multiple Vitamins-Minerals (CENTRUM SILVER ADULT 50+) TABS Take by mouth.   Yes [provider]  Triamcinolone Acetonide (TRIAMCINOLONE 0.1 % CREAM : EUCERIN) CREA Apply 1 application topically 3 (three) times daily. 07/10/14  Yes Darlyne Russian, MD  meloxicam (MOBIC) 7.5 MG tablet Take 1 tablet (7.5 mg total) by mouth daily. Patient not taking: Reported on 07/21/2021 06/10/20   Wendie Agreste, MD   Social History   Socioeconomic History   Marital status: Divorced    Spouse name: Not on file   Number of children: Not on file   Years of education: Not on file   Highest education level: Not on file  Occupational History   Occupation: Accountant    Employer: [employer]  Tobacco Use   Smoking status: Never   Smokeless tobacco: Never  Vaping Use   Vaping Use: Never used  Substance and Sexual Activity   Alcohol use: No   Drug use: No   Sexual activity: Not Currently  Other Topics Concern   Not on file  Social History Narrative   Divorced. Education: [employer]. Exercise: Bouflex 3 times a week for 30 minutes.   Social Determinants of Health   Financial Resource Strain: Not on file  Food Insecurity: Not on file  Transportation Needs: Not on file  Physical Activity: Not on file  Stress: Not on file  Social Connections: Not on file  Intimate Partner Violence: Not on file    Review of Systems 13 point review of systems per patient health survey noted.  Negative other than as indicated above or in HPI.    Objective:   Vitals:   07/21/21 0912  BP: 108/70  Pulse: 71  Resp: 16  Temp: 98.2 F (36.8 C)  TempSrc: Temporal  SpO2: 95%  Weight: 193 lb 9.6 oz (87.8 kg)  Height: 5\' 8"  (1.727 m)     Physical Exam Vitals reviewed.  Constitutional:      Appearance: He is well-developed.  HENT:     Head: Normocephalic and atraumatic.     Right Ear: External ear normal.      Left Ear: External ear normal.  Eyes:     Conjunctiva/sclera: Conjunctivae normal.     Pupils: Pupils are equal, round, and reactive to light.  Neck:     Thyroid: No thyromegaly.  Cardiovascular:     Rate and Rhythm: Normal rate and regular rhythm.     Heart sounds: Normal heart sounds.  Pulmonary:     Effort: Pulmonary effort is normal. No respiratory distress.     Breath sounds: Normal breath sounds. No wheezing.  Abdominal:     General: There is no distension.     Palpations: Abdomen is soft.     Tenderness: There is no abdominal tenderness.  Musculoskeletal:        General: Tenderness present. Normal range of motion.     Cervical back: Normal range of motion and neck supple.     Comments: Bilateral elbows full range of motion, skin intact, no effusion, no erythema.  Minimal tenderness at medial epicondyle left greater than right.  Olecranon and lateral epicondyle nontender.  Lymphadenopathy:     Cervical: No cervical adenopathy.  Skin:    General: Skin is warm and dry.  Neurological:     Mental Status: He is alert and oriented to person, place, and time.     Deep Tendon Reflexes: Reflexes are normal and symmetric.  Psychiatric:        Behavior: Behavior normal.       Assessment & Plan:  Jeffery Clements is a 65 y.o. male . Medicare annual wellness visit, subsequent - Plan: CBC with Differential/Platelet, Lipid panel, PSA, Hemoglobin A1c, Comprehensive metabolic panel  - - anticipatory guidance as below in AVS, screening labs if needed. Health maintenance items as above in HPI discussed/recommended as applicable.  - no concerning responses on depression, fall, or functional status screening. Any positive responses noted as above. Advanced directives discussed as in CH[facility].   Right rotator cuff tendonitis - Plan: meloxicam (MOBIC) 7.5 MG tablet Pain in joint of right shoulder - Plan: meloxicam (MOBIC) 7.5 MG tablet Medial epicondylitis of both elbows  -History of shoulder  pain, but will refill meloxicam temporarily for epicondylosis.  Advised to check home activity/work activities to see if there may be something  contributing to the medial epicondylitis.  Handout given, recheck next 6 weeks, consider PT.  Hyperlipidemia, unspecified hyperlipidemia type - Plan: Lipid panel, Comprehensive metabolic panel  -Check labs to decide on medications.  Weight has unfortunately increased, diet/exercise discussed and plans to improve.  Screening for diabetes mellitus - Plan: Hemoglobin A1c, Comprehensive metabolic panel  Screening for deficiency anemia - Plan: CBC with Differential/Platelet  Screening for prostate cancer - Plan: PSA Check PSA, continue follow-up with urology as planned  Insomnia, unspecified type  -Continue melatonin, RTC precautions if persistent nighttime wakening.  Sleep hygiene discussed with earlier bedtime.  Hypothyroidism, unspecified type - Plan: TSH, levothyroxine (SYNTHROID) 75 MCG tablet  -Weight gain likely related to diet/exercise.  Check updated TSH, continue same dose Synthroid.  Meds ordered this encounter  Medications   meloxicam (MOBIC) 7.5 MG tablet    Sig: Take 1 tablet (7.5 mg total) by mouth daily.    Dispense:  90 tablet    Refill:  1   levothyroxine (SYNTHROID) 75 MCG tablet    Sig: TAKE ONE TABLET BY MOUTH ONCE DAILY BEFORE  BREAKFAST    Dispense:  90 tablet    Refill:  2   Patient Instructions  Increased exercise and watching diet should help with weight and cholesterol.  Goal of 138min per week. Start low, go slow with intensity.   Melatonin is fine to use, but if you have more issues with sleep - return to discuss further. Earlier sleep time may help.   Call and schedule eye appointment.   Elbow pain appears to be medial epicondylitis or golfer's elbow.  See information below.  Take a look at your day-to-day activities to see if there may be anything causing or contributing to that discomfort.  Over-the-counter  brace is fine if needed, ice over that area if protected by towel temporarily, no more than 10 minutes at a time.  Recheck in 6 weeks if not significantly improved, sooner if worse. Mobic once per day ok temporarily.   Golfer's Elbow Golfer's elbow (medial epicondylitis) is a condition that results from inflammation of the strong bands of tissue (tendons) that attach your forearm muscles to the inside of your bone at the elbow. These tendons affect the muscles that bend the palm toward the wrist (flexion). The tendons become less flexible with age. This condition is called golfer's elbow because it is more common among people who constantly bend and twist their wrists, such as golfers. This injury is usually caused by repeated use of the same muscles. What are the causes? This condition is caused by: Repeatedly flexing, turning, or twisting your wrist. Frequently gripping objects with your hands. Sudden injury. What increases the risk? This condition is more likely to develop in people who play golf, baseball, or tennis. This injury is more common among people who have jobs that require the constant use of their hands, such as: People who use computers. Carpenters. Butchers. Musicians. What are the signs or symptoms? This condition causes elbow pain that may spread to your forearm and upper arm. Symptoms of this condition include: Pain at the inner elbow, forearm, or wrist. A weak grip in the hand. The pain may get worse when you bend your wrist downward. How is this diagnosed? This condition is diagnosed based on your symptoms, your medical history, and a physical exam. During the exam, your health care provider may: Test your grip strength. Move your wrist to check for pain. You may also have an MRI to: Confirm  the diagnosis. Look for other issues. Check for tears in the ligaments, muscles, or tendons. How is this treated? Treatment for this condition includes: Stopping all  activities that make you bend or twist your elbow or wrist and waiting until your pain and other symptoms go away before resuming those activities. Wearing an elbow brace or wrist splint to restrict the movements that cause symptoms. Icing your inner elbow, forearm, or wrist to relieve pain. Taking NSAIDs, such as ibuprofen, or getting corticosteroid injections to reduce pain and swelling. Doing stretching, range-of-motion, and strengthening exercises (physical therapy) as told by your health care provider. In rare cases, surgery may be needed if your condition does not improve. Follow these instructions at home: If you have a brace or splint: Wear the brace or splint as told by your health care provider. Remove it only as told by your health care provider. Check the skin around the brace or splint every day. Tell your health care provider about any concerns. Loosen the brace or splint if your fingers tingle, become numb, or turn cold and blue. Keep it clean. If the brace or splint is not waterproof: Do not let it get wet. Cover it with a watertight covering when you take a bath or shower. Managing pain, stiffness, and swelling  If directed, put ice on the injured area. To do this: If you have a removable brace or splint, remove it as told by your health care provider. Put ice in a plastic bag. Place a towel between your skin and the bag. Leave the ice on for 20 minutes, 2-3 times a day. Remove the ice if your skin turns bright red. This is very important. If you cannot feel pain, heat, or cold, you have a greater risk of damage to the area. Move your fingers often to avoid stiffness and swelling. Activity Rest your injured area as told by your health care provider. Return to your normal activities as told by your health care provider. Ask your health care provider what activities are safe for you. Do exercises as told by your health care provider. Lifestyle If your condition is caused  by sports, work with a trainer to make sure that you: Use the correct technique. Use the proper equipment. If your condition is work related, talk with your employer about ways to manage your condition at work. General instructions Take over-the-counter and prescription medicines only as told by your health care provider. Do not use any products that contain nicotine or tobacco. These products include cigarettes, chewing tobacco, and vaping devices, such as e-cigarettes. If you need help quitting, ask your health care provider. Keep all follow-up visits. This is important. How is this prevented? Before and after activity: Warm up and stretch before being active. Cool down and stretch after being active. Give your body time to rest between periods of activity. During activity: Make sure to use equipment that fits you. If you play golf, slow your golf swing to reduce shock in the arm when making contact with the ball. Maintain physical fitness, including: Strength. Flexibility. Endurance. Do exercises to strengthen the forearm muscles. Contact a health care provider if: Your pain does not improve or it gets worse. You notice numbness in your hand. Get help right away if: Your pain is severe. You cannot move your wrist. Summary Golfer's elbow, also called medial epicondylitis, is a condition that results from inflammation of the strong bands of tissue (tendons) that attach your forearm muscles to the inside of your  bone at the elbow. This injury usually results from overuse. Symptoms of this condition include decreased grip strength and pain at the inner elbow, forearm, or wrist. This injury is treated with rest, a brace or splint, ice, medicines, physical therapy, and surgery as needed. This information is not intended to replace advice given to you by your health care provider. Make sure you discuss any questions you have with your health care provider. Document Revised: 02/18/2020  Document Reviewed: 02/18/2020 Elsevier Patient Education  2022 [place].   Insomnia Insomnia is a sleep disorder that makes it difficult to fall asleep or stay asleep. Insomnia can cause fatigue, low energy, difficulty concentrating, mood swings, and poor performance at work or school. There are three different ways to classify insomnia: Difficulty falling asleep. Difficulty staying asleep. Waking up too early in the morning. Any type of insomnia can be long-term (chronic) or short-term (acute). Both are common. Short-term insomnia usually lasts for three months or less. Chronic insomnia occurs at least three times a week for longer than three months. What are the causes? Insomnia may be caused by another condition, situation, or substance, such as: Anxiety. Certain medicines. Gastroesophageal reflux disease (GERD) or other gastrointestinal conditions. Asthma or other breathing conditions. Restless legs syndrome, sleep apnea, or other sleep disorders. Chronic pain. Menopause. Stroke. Abuse of alcohol, tobacco, or illegal drugs. Mental health conditions, such as depression. Caffeine. Neurological disorders, such as Alzheimer's disease. An overactive thyroid (hyperthyroidism). Sometimes, the cause of insomnia may not be known. What increases the risk? Risk factors for insomnia include: Gender. Women are affected more often than men. Age. Insomnia is more common as you get older. Stress. Lack of exercise. Irregular work schedule or working night shifts. Traveling between different time zones. Certain medical and mental health conditions. What are the signs or symptoms? If you have insomnia, the main symptom is having trouble falling asleep or having trouble staying asleep. This may lead to other symptoms, such as: Feeling fatigued or having low energy. Feeling nervous about going to sleep. Not feeling rested in the morning. Having trouble concentrating. Feeling irritable,  anxious, or depressed. How is this diagnosed? This condition may be diagnosed based on: Your symptoms and medical history. Your health care provider may ask about: Your sleep habits. Any medical conditions you have. Your mental health. A physical exam. How is this treated? Treatment for insomnia depends on the cause. Treatment may focus on treating an underlying condition that is causing insomnia. Treatment may also include: Medicines to help you sleep. Counseling or therapy. Lifestyle adjustments to help you sleep better. Follow these instructions at home: Eating and drinking  Limit or avoid alcohol, caffeinated beverages, and cigarettes, especially close to bedtime. These can disrupt your sleep. Do not eat a large meal or eat spicy foods right before bedtime. This can lead to digestive discomfort that can make it hard for you to sleep. Sleep habits  Keep a sleep diary to help you and your health care provider figure out what could be causing your insomnia. Write down: When you sleep. When you wake up during the night. How well you sleep. How rested you feel the next day. Any side effects of medicines you are taking. What you eat and drink. Make your bedroom a dark, comfortable place where it is easy to fall asleep. Put up shades or blackout curtains to block light from outside. Use a white noise machine to block noise. Keep the temperature cool. Limit screen use before bedtime. This  includes: Watching TV. Using your smartphone, tablet, or computer. Stick to a routine that includes going to bed and waking up at the same times every day and night. This can help you fall asleep faster. Consider making a quiet activity, such as reading, part of your nighttime routine. Try to avoid taking naps during the day so that you sleep better at night. Get out of bed if you are still awake after 15 minutes of trying to sleep. Keep the lights down, but try reading or doing a quiet activity.  When you feel sleepy, go back to bed. General instructions Take over-the-counter and prescription medicines only as told by your health care provider. Exercise regularly, as told by your health care provider. Avoid exercise starting several hours before bedtime. Use relaxation techniques to manage stress. Ask your health care provider to suggest some techniques that may work well for you. These may include: Breathing exercises. Routines to release muscle tension. Visualizing peaceful scenes. Make sure that you drive carefully. Avoid driving if you feel very sleepy. Keep all follow-up visits as told by your health care provider. This is important. Contact a health care provider if: You are tired throughout the day. You have trouble in your daily routine due to sleepiness. You continue to have sleep problems, or your sleep problems get worse. Get help right away if: You have serious thoughts about hurting yourself or someone else. If you ever feel like you may hurt yourself or others, or have thoughts about taking your own life, get help right away. You can go to your nearest emergency department or call: Your local emergency services (911 in the [place] A suicide crisis helpline, such as the [place] at 615-362-7093 or 988 in the [place]. This is open 24 hours a day. Summary Insomnia is a sleep disorder that makes it difficult to fall asleep or stay asleep. Insomnia can be long-term (chronic) or short-term (acute). Treatment for insomnia depends on the cause. Treatment may focus on treating an underlying condition that is causing insomnia. Keep a sleep diary to help you and your health care provider figure out what could be causing your insomnia. This information is not intended to replace advice given to you by your health care provider. Make sure you discuss any questions you have with your health care provider. Document Revised: 03/03/2021 Document Reviewed:  06/18/2020 Elsevier Patient Education  [place] 65 Years and Older, Male Preventive care refers to lifestyle choices and visits with your health care provider that can promote health and wellness. Preventive care visits are also called wellness exams. What can I expect for my preventive care visit? Counseling During your preventive care visit, your health care provider may ask about your: Medical history, including: Past medical problems. Family medical history. History of falls. Current health, including: Emotional well-being. Home life and relationship well-being. Sexual activity. Memory and ability to understand (cognition). Lifestyle, including: Alcohol, nicotine or tobacco, and drug use. Access to firearms. Diet, exercise, and sleep habits. Work and work Statistician. Sunscreen use. Safety issues such as seatbelt and bike helmet use. Physical exam Your health care provider will check your: Height and weight. These may be used to calculate your BMI (body mass index). BMI is a measurement that tells if you are at a healthy weight. Waist circumference. This measures the distance around your waistline. This measurement also tells if you are at a healthy weight and may help predict your risk of certain diseases, such  as type 2 diabetes and high blood pressure. Heart rate and blood pressure. Body temperature. Skin for abnormal spots. What immunizations do I need? Vaccines are usually given at various ages, according to a schedule. Your health care provider will recommend vaccines for you based on your age, medical history, and lifestyle or other factors, such as travel or where you work. What tests do I need? Screening Your health care provider may recommend screening tests for certain conditions. This may include: Lipid and cholesterol levels. Diabetes screening. This is done by checking your blood sugar (glucose) after you have not eaten for a while  (fasting). Hepatitis C test. Hepatitis B test. HIV (human immunodeficiency virus) test. STI (sexually transmitted infection) testing, if you are at risk. Lung cancer screening. Colorectal cancer screening. Prostate cancer screening. Abdominal aortic aneurysm (AAA) screening. You may need this if you are a current or former smoker. Talk with your health care provider about your test results, treatment options, and if necessary, the need for more tests. Follow these instructions at home: Eating and drinking  Eat a diet that includes fresh fruits and vegetables, whole grains, lean protein, and low-fat dairy products. Limit your intake of foods with high amounts of sugar, saturated fats, and salt. Take vitamin and mineral supplements as recommended by your health care provider. Do not drink alcohol if your health care provider tells you not to drink. If you drink alcohol: Limit how much you have to 0-2 drinks a day. Know how much alcohol is in your drink. In the [place] one drink equals one 12 oz bottle of beer (355 mL), one 5 oz glass of wine (148 mL), or one 1 oz glass of hard liquor (44 mL). Lifestyle Brush your teeth every morning and night with fluoride toothpaste. Floss one time each day. Exercise for at least 30 minutes 5 or more days each week. Do not use any products that contain nicotine or tobacco. These products include cigarettes, chewing tobacco, and vaping devices, such as e-cigarettes. If you need help quitting, ask your health care provider. Do not use drugs. If you are sexually active, practice safe sex. Use a condom or other form of protection to prevent STIs. Take aspirin only as told by your health care provider. Make sure that you understand how much to take and what form to take. Work with your health care provider to find out whether it is safe and beneficial for you to take aspirin daily. Ask your health care provider if you need to take a cholesterol-lowering medicine  (statin). Find healthy ways to manage stress, such as: Meditation, yoga, or listening to music. Journaling. Talking to a trusted person. Spending time with friends and family. Safety Always wear your seat belt while driving or riding in a vehicle. Do not drive: If you have been drinking alcohol. Do not ride with someone who has been drinking. When you are tired or distracted. While texting. If you have been using any mind-altering substances or drugs. Wear a helmet and other protective equipment during sports activities. If you have firearms in your house, make sure you follow all gun safety procedures. Minimize exposure to UV radiation to reduce your risk of skin cancer. What's next? Visit your health care provider once a year for an annual wellness visit. Ask your health care provider how often you should have your eyes and teeth checked. Stay up to date on all vaccines. This information is not intended to replace advice given to you by your health  care provider. Make sure you discuss any questions you have with your health care provider. Document Revised: 02/03/2021 Document Reviewed: 02/03/2021 Elsevier Patient Education  2022 [place],   [name], MD [place] 07/21/21 10:06 AM

## 2021-07-21 NOTE — Patient Instructions (Addendum)
Increased exercise and watching diet should help with weight and cholesterol.  Goal of 174min per week. Start low, go slow with intensity.   Melatonin is fine to use, but if you have more issues with sleep - return to discuss further. Earlier sleep time may help.   Call and schedule eye appointment.   Elbow pain appears to be medial epicondylitis or golfer's elbow.  See information below.  Take a look at your day-to-day activities to see if there may be anything causing or contributing to that discomfort.  Over-the-counter brace is fine if needed, ice over that area if protected by towel temporarily, no more than 10 minutes at a time.  Recheck in 6 weeks if not significantly improved, sooner if worse. Mobic once per day ok temporarily.  Check PSA, continue follow-up as planned with urology.  Golfer's Elbow Golfer's elbow (medial epicondylitis) is a condition that results from inflammation of the strong bands of tissue (tendons) that attach your forearm muscles to the inside of your bone at the elbow. These tendons affect the muscles that bend the palm toward the wrist (flexion). The tendons become less flexible with age. This condition is called golfer's elbow because it is more common among people who constantly bend and twist their wrists, such as golfers. This injury is usually caused by repeated use of the same muscles. What are the causes? This condition is caused by: Repeatedly flexing, turning, or twisting your wrist. Frequently gripping objects with your hands. Sudden injury. What increases the risk? This condition is more likely to develop in people who play golf, baseball, or tennis. This injury is more common among people who have jobs that require the constant use of their hands, such as: People who use computers. Carpenters. Butchers. Musicians. What are the signs or symptoms? This condition causes elbow pain that may spread to your forearm and upper arm. Symptoms of this  condition include: Pain at the inner elbow, forearm, or wrist. A weak grip in the hand. The pain may get worse when you bend your wrist downward. How is this diagnosed? This condition is diagnosed based on your symptoms, your medical history, and a physical exam. During the exam, your health care provider may: Test your grip strength. Move your wrist to check for pain. You may also have an MRI to: Confirm the diagnosis. Look for other issues. Check for tears in the ligaments, muscles, or tendons. How is this treated? Treatment for this condition includes: Stopping all activities that make you bend or twist your elbow or wrist and waiting until your pain and other symptoms go away before resuming those activities. Wearing an elbow brace or wrist splint to restrict the movements that cause symptoms. Icing your inner elbow, forearm, or wrist to relieve pain. Taking NSAIDs, such as ibuprofen, or getting corticosteroid injections to reduce pain and swelling. Doing stretching, range-of-motion, and strengthening exercises (physical therapy) as told by your health care provider. In rare cases, surgery may be needed if your condition does not improve. Follow these instructions at home: If you have a brace or splint: Wear the brace or splint as told by your health care provider. Remove it only as told by your health care provider. Check the skin around the brace or splint every day. Tell your health care provider about any concerns. Loosen the brace or splint if your fingers tingle, become numb, or turn cold and blue. Keep it clean. If the brace or splint is not waterproof: Do not let it  get wet. Cover it with a watertight covering when you take a bath or shower. Managing pain, stiffness, and swelling  If directed, put ice on the injured area. To do this: If you have a removable brace or splint, remove it as told by your health care provider. Put ice in a plastic bag. Place a towel between  your skin and the bag. Leave the ice on for 20 minutes, 2-3 times a day. Remove the ice if your skin turns bright red. This is very important. If you cannot feel pain, heat, or cold, you have a greater risk of damage to the area. Move your fingers often to avoid stiffness and swelling. Activity Rest your injured area as told by your health care provider. Return to your normal activities as told by your health care provider. Ask your health care provider what activities are safe for you. Do exercises as told by your health care provider. Lifestyle If your condition is caused by sports, work with a trainer to make sure that you: Use the correct technique. Use the proper equipment. If your condition is work related, talk with your employer about ways to manage your condition at work. General instructions Take over-the-counter and prescription medicines only as told by your health care provider. Do not use any products that contain nicotine or tobacco. These products include cigarettes, chewing tobacco, and vaping devices, such as e-cigarettes. If you need help quitting, ask your health care provider. Keep all follow-up visits. This is important. How is this prevented? Before and after activity: Warm up and stretch before being active. Cool down and stretch after being active. Give your body time to rest between periods of activity. During activity: Make sure to use equipment that fits you. If you play golf, slow your golf swing to reduce shock in the arm when making contact with the ball. Maintain physical fitness, including: Strength. Flexibility. Endurance. Do exercises to strengthen the forearm muscles. Contact a health care provider if: Your pain does not improve or it gets worse. You notice numbness in your hand. Get help right away if: Your pain is severe. You cannot move your wrist. Summary Golfer's elbow, also called medial epicondylitis, is a condition that results from  inflammation of the strong bands of tissue (tendons) that attach your forearm muscles to the inside of your bone at the elbow. This injury usually results from overuse. Symptoms of this condition include decreased grip strength and pain at the inner elbow, forearm, or wrist. This injury is treated with rest, a brace or splint, ice, medicines, physical therapy, and surgery as needed. This information is not intended to replace advice given to you by your health care provider. Make sure you discuss any questions you have with your health care provider. Document Revised: 02/18/2020 Document Reviewed: 02/18/2020 Elsevier Patient Education  2022 [place].   Insomnia Insomnia is a sleep disorder that makes it difficult to fall asleep or stay asleep. Insomnia can cause fatigue, low energy, difficulty concentrating, mood swings, and poor performance at work or school. There are three different ways to classify insomnia: Difficulty falling asleep. Difficulty staying asleep. Waking up too early in the morning. Any type of insomnia can be long-term (chronic) or short-term (acute). Both are common. Short-term insomnia usually lasts for three months or less. Chronic insomnia occurs at least three times a week for longer than three months. What are the causes? Insomnia may be caused by another condition, situation, or substance, such as: Anxiety. Certain medicines. Gastroesophageal  reflux disease (GERD) or other gastrointestinal conditions. Asthma or other breathing conditions. Restless legs syndrome, sleep apnea, or other sleep disorders. Chronic pain. Menopause. Stroke. Abuse of alcohol, tobacco, or illegal drugs. Mental health conditions, such as depression. Caffeine. Neurological disorders, such as Alzheimer's disease. An overactive thyroid (hyperthyroidism). Sometimes, the cause of insomnia may not be known. What increases the risk? Risk factors for insomnia include: Gender. Women are  affected more often than men. Age. Insomnia is more common as you get older. Stress. Lack of exercise. Irregular work schedule or working night shifts. Traveling between different time zones. Certain medical and mental health conditions. What are the signs or symptoms? If you have insomnia, the main symptom is having trouble falling asleep or having trouble staying asleep. This may lead to other symptoms, such as: Feeling fatigued or having low energy. Feeling nervous about going to sleep. Not feeling rested in the morning. Having trouble concentrating. Feeling irritable, anxious, or depressed. How is this diagnosed? This condition may be diagnosed based on: Your symptoms and medical history. Your health care provider may ask about: Your sleep habits. Any medical conditions you have. Your mental health. A physical exam. How is this treated? Treatment for insomnia depends on the cause. Treatment may focus on treating an underlying condition that is causing insomnia. Treatment may also include: Medicines to help you sleep. Counseling or therapy. Lifestyle adjustments to help you sleep better. Follow these instructions at home: Eating and drinking  Limit or avoid alcohol, caffeinated beverages, and cigarettes, especially close to bedtime. These can disrupt your sleep. Do not eat a large meal or eat spicy foods right before bedtime. This can lead to digestive discomfort that can make it hard for you to sleep. Sleep habits  Keep a sleep diary to help you and your health care provider figure out what could be causing your insomnia. Write down: When you sleep. When you wake up during the night. How well you sleep. How rested you feel the next day. Any side effects of medicines you are taking. What you eat and drink. Make your bedroom a dark, comfortable place where it is easy to fall asleep. Put up shades or blackout curtains to block light from outside. Use a white noise machine  to block noise. Keep the temperature cool. Limit screen use before bedtime. This includes: Watching TV. Using your smartphone, tablet, or computer. Stick to a routine that includes going to bed and waking up at the same times every day and night. This can help you fall asleep faster. Consider making a quiet activity, such as reading, part of your nighttime routine. Try to avoid taking naps during the day so that you sleep better at night. Get out of bed if you are still awake after 15 minutes of trying to sleep. Keep the lights down, but try reading or doing a quiet activity. When you feel sleepy, go back to bed. General instructions Take over-the-counter and prescription medicines only as told by your health care provider. Exercise regularly, as told by your health care provider. Avoid exercise starting several hours before bedtime. Use relaxation techniques to manage stress. Ask your health care provider to suggest some techniques that may work well for you. These may include: Breathing exercises. Routines to release muscle tension. Visualizing peaceful scenes. Make sure that you drive carefully. Avoid driving if you feel very sleepy. Keep all follow-up visits as told by your health care provider. This is important. Contact a health care provider if: You are  tired throughout the day. You have trouble in your daily routine due to sleepiness. You continue to have sleep problems, or your sleep problems get worse. Get help right away if: You have serious thoughts about hurting yourself or someone else. If you ever feel like you may hurt yourself or others, or have thoughts about taking your own life, get help right away. You can go to your nearest emergency department or call: Your local emergency services (911 in the [place] A suicide crisis helpline, such as the [place] at 678 829 5334 or 988 in the [place]. This is open 24 hours a day. Summary Insomnia is a sleep  disorder that makes it difficult to fall asleep or stay asleep. Insomnia can be long-term (chronic) or short-term (acute). Treatment for insomnia depends on the cause. Treatment may focus on treating an underlying condition that is causing insomnia. Keep a sleep diary to help you and your health care provider figure out what could be causing your insomnia. This information is not intended to replace advice given to you by your health care provider. Make sure you discuss any questions you have with your health care provider. Document Revised: 03/03/2021 Document Reviewed: 06/18/2020 Elsevier Patient Education  [place] 65 Years and Older, Male Preventive care refers to lifestyle choices and visits with your health care provider that can promote health and wellness. Preventive care visits are also called wellness exams. What can I expect for my preventive care visit? Counseling During your preventive care visit, your health care provider may ask about your: Medical history, including: Past medical problems. Family medical history. History of falls. Current health, including: Emotional well-being. Home life and relationship well-being. Sexual activity. Memory and ability to understand (cognition). Lifestyle, including: Alcohol, nicotine or tobacco, and drug use. Access to firearms. Diet, exercise, and sleep habits. Work and work Statistician. Sunscreen use. Safety issues such as seatbelt and bike helmet use. Physical exam Your health care provider will check your: Height and weight. These may be used to calculate your BMI (body mass index). BMI is a measurement that tells if you are at a healthy weight. Waist circumference. This measures the distance around your waistline. This measurement also tells if you are at a healthy weight and may help predict your risk of certain diseases, such as type 2 diabetes and high blood pressure. Heart rate and blood  pressure. Body temperature. Skin for abnormal spots. What immunizations do I need? Vaccines are usually given at various ages, according to a schedule. Your health care provider will recommend vaccines for you based on your age, medical history, and lifestyle or other factors, such as travel or where you work. What tests do I need? Screening Your health care provider may recommend screening tests for certain conditions. This may include: Lipid and cholesterol levels. Diabetes screening. This is done by checking your blood sugar (glucose) after you have not eaten for a while (fasting). Hepatitis C test. Hepatitis B test. HIV (human immunodeficiency virus) test. STI (sexually transmitted infection) testing, if you are at risk. Lung cancer screening. Colorectal cancer screening. Prostate cancer screening. Abdominal aortic aneurysm (AAA) screening. You may need this if you are a current or former smoker. Talk with your health care provider about your test results, treatment options, and if necessary, the need for more tests. Follow these instructions at home: Eating and drinking  Eat a diet that includes fresh fruits and vegetables, whole grains, lean protein, and low-fat dairy products. Limit  your intake of foods with high amounts of sugar, saturated fats, and salt. Take vitamin and mineral supplements as recommended by your health care provider. Do not drink alcohol if your health care provider tells you not to drink. If you drink alcohol: Limit how much you have to 0-2 drinks a day. Know how much alcohol is in your drink. In the [place] one drink equals one 12 oz bottle of beer (355 mL), one 5 oz glass of wine (148 mL), or one 1 oz glass of hard liquor (44 mL). Lifestyle Brush your teeth every morning and night with fluoride toothpaste. Floss one time each day. Exercise for at least 30 minutes 5 or more days each week. Do not use any products that contain nicotine or tobacco. These  products include cigarettes, chewing tobacco, and vaping devices, such as e-cigarettes. If you need help quitting, ask your health care provider. Do not use drugs. If you are sexually active, practice safe sex. Use a condom or other form of protection to prevent STIs. Take aspirin only as told by your health care provider. Make sure that you understand how much to take and what form to take. Work with your health care provider to find out whether it is safe and beneficial for you to take aspirin daily. Ask your health care provider if you need to take a cholesterol-lowering medicine (statin). Find healthy ways to manage stress, such as: Meditation, yoga, or listening to music. Journaling. Talking to a trusted person. Spending time with friends and family. Safety Always wear your seat belt while driving or riding in a vehicle. Do not drive: If you have been drinking alcohol. Do not ride with someone who has been drinking. When you are tired or distracted. While texting. If you have been using any mind-altering substances or drugs. Wear a helmet and other protective equipment during sports activities. If you have firearms in your house, make sure you follow all gun safety procedures. Minimize exposure to UV radiation to reduce your risk of skin cancer. What's next? Visit your health care provider once a year for an annual wellness visit. Ask your health care provider how often you should have your eyes and teeth checked. Stay up to date on all vaccines. This information is not intended to replace advice given to you by your health care provider. Make sure you discuss any questions you have with your health care provider. Document Revised: 02/03/2021 Document Reviewed: 02/03/2021 Elsevier Patient Education  [place].

## 2021-07-22 ENCOUNTER — Encounter: Payer: Self-pay | Admitting: Family Medicine

## 2021-08-09 ENCOUNTER — Encounter: Payer: Self-pay | Admitting: Family Medicine

## 2021-08-09 NOTE — Telephone Encounter (Signed)
Please schedule virtual visit with any provider Tuesday 12/20 if possible. Thanks.

## 2021-08-10 ENCOUNTER — Telehealth (INDEPENDENT_AMBULATORY_CARE_PROVIDER_SITE_OTHER): Payer: BC Managed Care – PPO | Admitting: Family Medicine

## 2021-08-10 ENCOUNTER — Encounter: Payer: Self-pay | Admitting: Family Medicine

## 2021-08-10 VITALS — Ht 68.0 in

## 2021-08-10 DIAGNOSIS — R059 Cough, unspecified: Secondary | ICD-10-CM

## 2021-08-10 DIAGNOSIS — U071 COVID-19: Secondary | ICD-10-CM | POA: Diagnosis not present

## 2021-08-10 MED ORDER — MOLNUPIRAVIR EUA 200MG CAPSULE: 4.0000 | ORAL_CAPSULE | Freq: Two times a day (BID) | ORAL | 0 refills | Status: AC

## 2021-08-10 MED ORDER — BENZONATATE 100 MG PO CAPS: 200.0000 mg | ORAL_CAPSULE | Freq: Two times a day (BID) | ORAL | 0 refills | Status: AC | PRN

## 2021-08-10 NOTE — Progress Notes (Signed)
Virtual Visit via Video Note I connected with Jeffery Clements on 08/10/21 by a video enabled telemedicine application and verified that I am speaking with the correct person using two identifiers.  Location patient: home Location provider:work office Persons participating in the virtual visit: patient, provider  I discussed the limitations of evaluation and management by telemedicine and the availability of in person appointments. The patient expressed understanding and agreed to proceed.  Chief Complaint  Patient presents with   Covid Positive   HPI: Jeffery Clements is a 65 yo male with hx of hearing loss and HLD c/o 3-4 days of respiratory symptoms, had a positive COVID 19 test Sunday. Friday night he started with sore throat following by nasal congestion, rhinorrhea,post nasal drainage, and productive cough with clear sputum. Yesterday he had diarrhea x 1. Subjective fever, chills, and fatigue.  He has had some nose bleeding and traces of blood in sputum.  Negative for bruising,blood in stool,or gross hematuria.  Negative for anosmia,ageusia,CP,palpitations,wheezing,SOB,abdominal pain, N/V,urinary symptoms,or skin rash. He has taken Nyquil. Feeling better today.  Co-workers have been out sick, not sure about Dx. COVID 19 vaccination completed.  ROS: See pertinent positives and negatives per HPI.  History reviewed. No pertinent past medical history.  Past Surgical History:  Procedure Laterality Date   VASECTOMY     Family History  Problem Relation Age of Onset   Lung disease Mother     Social History   Socioeconomic History   Marital status: Divorced    Spouse name: Not on file   Number of children: Not on file   Years of education: Not on file   Highest education level: Not on file  Occupational History   Occupation: Aeronautical engineer: [employer]  Tobacco Use   Smoking status: Never   Smokeless tobacco: Never  Vaping Use   Vaping Use: Never used   Substance and Sexual Activity   Alcohol use: No   Drug use: No   Sexual activity: Not Currently  Other Topics Concern   Not on file  Social History Narrative   Divorced. Education: [employer]. Exercise: [employer] 3 times a week for 30 minutes.   Social Determinants of Health   Financial Resource Strain: Not on file  Food Insecurity: Not on file  Transportation Needs: Not on file  Physical Activity: Not on file  Stress: Not on file  Social Connections: Not on file  Intimate Partner Violence: Not on file   Current Outpatient Medications:    clobetasol cream (TEMOVATE) 3.23 %, Apply 1 application topically 2 (two) times daily., Disp: 60 g, Rfl: 1   levothyroxine (SYNTHROID) 75 MCG tablet, TAKE ONE TABLET BY MOUTH ONCE DAILY BEFORE  BREAKFAST, Disp: 90 tablet, Rfl: 2   meloxicam (MOBIC) 7.5 MG tablet, Take 1 tablet (7.5 mg total) by mouth daily., Disp: 90 tablet, Rfl: 1   Multiple Vitamins-Minerals (CENTRUM SILVER ADULT 50+) TABS, Take by mouth., Disp: , Rfl:    Triamcinolone Acetonide (TRIAMCINOLONE 0.1 % CREAM : EUCERIN) CREA, Apply 1 application topically 3 (three) times daily., Disp: 1 each, Rfl: 3  EXAM:  VITALS per patient if applicable:Ht 5\' 8"  (1.727 m)    BMI 29.44 kg/m   GENERAL: alert, oriented, appears well and in no acute distress  HEENT: atraumatic, conjunctiva clear, no obvious abnormalities on inspection of external nose and ears  NECK: normal movements of the head and neck  LUNGS: on inspection no signs of respiratory distress, breathing rate appears normal, no obvious  gross SOB, gasping or wheezing  CV: no obvious cyanosis  MS: moves all visible extremities without noticeable abnormality  PSYCH/NEURO: pleasant and cooperative, no obvious depression or anxiety, speech and thought processing grossly intact  ASSESSMENT AND PLAN:  Discussed the following assessment and plan:  COVID-19 virus infection - Plan: molnupiravir EUA (LAGEVRIO) 200 mg CAPS capsule We  discussed Dx,possible complications and treatment options. He has a mild case with some risk for complications. We discussed oral antiviral options and side effects. He agrees with trying Molnupiravir. Symptomatic treatment with plenty of fluids,rest,tylenol 500 mg 3-4 times per day prn. Throat lozenges if needed for sore throat. 5-7 days of quarantine recommended. Clearly instructed about warning signs.  Cough, unspecified type - Plan: benzonatate (TESSALON) 100 MG capsule Explained that cough and congestion may last a few more days and even weeks after acute symptoms have resolved.  We discussed possible serious and likely etiologies, options for evaluation and workup, limitations of telemedicine visit vs in person visit, treatment, treatment risks and precautions. The patient was advised to call back or seek an in-person evaluation if the symptoms worsen or if the condition fails to improve as anticipated. I discussed the assessment and treatment plan with the patient. Jeffery Clements was provided an opportunity to ask questions and all were answered. He agreed with the plan and demonstrated an understanding of the instructions.  Return if symptoms worsen or fail to improve.  Betty G. Martinique, MD  [facility]. [place] office.

## 2021-08-10 NOTE — Telephone Encounter (Signed)
Pt is scheduled for an appt at [facility] today with Dr. Martinique

## 2021-08-24 DIAGNOSIS — L570 Actinic keratosis: Secondary | ICD-10-CM | POA: Diagnosis not present

## 2021-09-01 ENCOUNTER — Ambulatory Visit: Payer: BC Managed Care – PPO | Admitting: Family Medicine

## 2021-09-27 DIAGNOSIS — R311 Benign essential microscopic hematuria: Secondary | ICD-10-CM | POA: Diagnosis not present

## 2021-09-28 DIAGNOSIS — L570 Actinic keratosis: Secondary | ICD-10-CM | POA: Diagnosis not present

## 2021-11-18 DIAGNOSIS — L570 Actinic keratosis: Secondary | ICD-10-CM | POA: Diagnosis not present

## 2022-06-01 DIAGNOSIS — D492 Neoplasm of unspecified behavior of bone, soft tissue, and skin: Secondary | ICD-10-CM | POA: Diagnosis not present

## 2022-06-01 DIAGNOSIS — C44329 Squamous cell carcinoma of skin of other parts of face: Secondary | ICD-10-CM | POA: Diagnosis not present

## 2022-06-21 DIAGNOSIS — C44329 Squamous cell carcinoma of skin of other parts of face: Secondary | ICD-10-CM | POA: Diagnosis not present

## 2022-07-01 DIAGNOSIS — Z23 Encounter for immunization: Secondary | ICD-10-CM | POA: Diagnosis not present

## 2022-07-07 DIAGNOSIS — L814 Other melanin hyperpigmentation: Secondary | ICD-10-CM | POA: Diagnosis not present

## 2022-07-07 DIAGNOSIS — L538 Other specified erythematous conditions: Secondary | ICD-10-CM | POA: Diagnosis not present

## 2022-07-07 DIAGNOSIS — D225 Melanocytic nevi of trunk: Secondary | ICD-10-CM | POA: Diagnosis not present

## 2022-07-07 DIAGNOSIS — L821 Other seborrheic keratosis: Secondary | ICD-10-CM | POA: Diagnosis not present

## 2022-07-07 DIAGNOSIS — L578 Other skin changes due to chronic exposure to nonionizing radiation: Secondary | ICD-10-CM | POA: Diagnosis not present

## 2022-07-07 DIAGNOSIS — B078 Other viral warts: Secondary | ICD-10-CM | POA: Diagnosis not present

## 2022-07-07 DIAGNOSIS — L570 Actinic keratosis: Secondary | ICD-10-CM | POA: Diagnosis not present

## 2022-08-31 ENCOUNTER — Other Ambulatory Visit: Payer: Self-pay

## 2022-08-31 DIAGNOSIS — E039 Hypothyroidism, unspecified: Secondary | ICD-10-CM

## 2022-08-31 MED ORDER — LEVOTHYROXINE SODIUM 75 MCG PO TABS: ORAL_TABLET | ORAL | 0 refills | Status: DC

## 2022-09-15 ENCOUNTER — Ambulatory Visit (INDEPENDENT_AMBULATORY_CARE_PROVIDER_SITE_OTHER): Payer: 59 | Admitting: Family Medicine

## 2022-09-15 ENCOUNTER — Encounter: Payer: Self-pay | Admitting: Family Medicine

## 2022-09-15 VITALS — BP 130/76 | HR 71 | Ht 68.25 in | Wt 183.6 lb

## 2022-09-15 DIAGNOSIS — Z0000 Encounter for general adult medical examination without abnormal findings: Secondary | ICD-10-CM

## 2022-09-15 DIAGNOSIS — Z125 Encounter for screening for malignant neoplasm of prostate: Secondary | ICD-10-CM

## 2022-09-15 DIAGNOSIS — E785 Hyperlipidemia, unspecified: Secondary | ICD-10-CM

## 2022-09-15 DIAGNOSIS — Z131 Encounter for screening for diabetes mellitus: Secondary | ICD-10-CM | POA: Diagnosis not present

## 2022-09-15 DIAGNOSIS — E039 Hypothyroidism, unspecified: Secondary | ICD-10-CM

## 2022-09-15 DIAGNOSIS — R7303 Prediabetes: Secondary | ICD-10-CM

## 2022-09-15 DIAGNOSIS — G4700 Insomnia, unspecified: Secondary | ICD-10-CM

## 2022-09-15 LAB — LIPID PANEL
Cholesterol: 222 mg/dL — ABNORMAL HIGH (ref 0–200)
HDL: 54.7 mg/dL (ref >39.00)
LDL Cholesterol: 128 mg/dL — ABNORMAL HIGH (ref 0–99)
NonHDL: 167.15
Total CHOL/HDL Ratio: 4
Triglycerides: 195 mg/dL — ABNORMAL HIGH (ref 0.0–149.0)
VLDL: 39 mg/dL (ref 0.0–40.0)

## 2022-09-15 LAB — COMPREHENSIVE METABOLIC PANEL
ALT: 17 U/L (ref 0–53)
AST: 17 U/L (ref 0–37)
Albumin: 4.7 g/dL (ref 3.5–5.2)
Alkaline Phosphatase: 85 U/L (ref 39–117)
BUN: 13 mg/dL (ref 6–23)
CO2: 30 mEq/L (ref 19–32)
Calcium: 10.1 mg/dL (ref 8.4–10.5)
Chloride: 100 mEq/L (ref 96–112)
Creatinine, Ser: 0.9 mg/dL (ref 0.40–1.50)
GFR: 88.02 mL/min (ref >60.00)
Glucose, Bld: 93 mg/dL (ref 70–99)
Potassium: 4.9 mEq/L (ref 3.5–5.1)
Sodium: 139 mEq/L (ref 135–145)
Total Bilirubin: 1.1 mg/dL (ref 0.2–1.2)
Total Protein: 7.6 g/dL (ref 6.0–8.3)

## 2022-09-15 LAB — PSA, MEDICARE: PSA: 0.79 ng/ml (ref 0.10–4.00)

## 2022-09-15 LAB — HEMOGLOBIN A1C: Hgb A1c MFr Bld: 6.2 % (ref 4.6–6.5)

## 2022-09-15 LAB — TSH: TSH: 7.01 u[IU]/mL — ABNORMAL HIGH (ref 0.35–5.50)

## 2022-09-15 MED ORDER — LEVOTHYROXINE SODIUM 75 MCG PO TABS: ORAL_TABLET | ORAL | 3 refills | Status: DC

## 2022-09-15 NOTE — Progress Notes (Signed)
Subjective:  Patient ID: Jeffery Clements, male    DOB: March 08, 1956  Age: 67 y.o. MRN: [id number]  CC:  Chief Complaint  Patient presents with   Medication Management    Pt doing well, fasting     HPI Jeffery Clements presents for Annual Exam No health changes since last visit.   PCP, [facility], prior history of basal cell cancer, had surgery for skin CA on scalp in November. Regular follow up. Appt next month.  Urology, Dr. Lovena Neighbours, history of BPH, microscopic hematuria.Urology note reviewed from September 27, 2021.  Renal ultrasound was normal.  No additional surveillance of asymptomatic microscopic return if warranted at that time.  Follow-up as needed. No gross hematuria, no urinary issues.   Hypothyroidism: Lab Results  Component Value Date   TSH 4.85 07/21/2021  Taking medication daily.  Synthroid 75 mcg daily. No new hot or cold intolerance. No new hair or skin changes, heart palpitations or new fatigue. No new weight changes. Intentional weight loss.   Hyperlipidemia: Diet/exercise approach, plan to increase exercise at his last physical in November 2022.  Weight has improved 10 pounds since that time. Had been doing better and recent decreased exercise - plans to increase exercise.  Wt Readings from Last 3 Encounters:  09/15/22 183 lb 9.6 oz (83.3 kg)  07/21/21 193 lb 9.6 oz (87.8 kg)  02/15/21 186 lb 6.4 oz (84.6 kg)   The 10-year ASCVD risk score (Arnett DK, et al., 2019) is: 15.6%   Values used to calculate the score:     Age: 20 years     Sex: Male     Is Non-Hispanic African American: No     Diabetic: No     Tobacco smoker: No     Systolic Blood Pressure: 885 mmHg     Is BP treated: No     HDL Cholesterol: 56.4 mg/dL     Total Cholesterol: 209 mg/dL  Lab Results  Component Value Date   CHOL 209 (H) 07/21/2021   HDL 56.40 07/21/2021   LDLCALC 129 (H) 07/21/2021   TRIG 118.0 07/21/2021   CHOLHDL 4 07/21/2021   Lab Results   Component Value Date   ALT 27 07/21/2021   AST 20 07/21/2021   ALKPHOS 94 07/21/2021   BILITOT 1.2 07/21/2021         09/15/2022    9:12 AM 07/21/2021    9:15 AM 02/15/2021    1:29 PM 07/09/2020    3:09 PM 06/10/2020    9:53 AM  Depression screen PHQ 2/9  Decreased Interest 0 0 0 0 0  Down, Depressed, Hopeless 0 0 0 0 0  PHQ - 2 Score 0 0 0 0 0  Altered sleeping 1 1 0    Tired, decreased energy 0 0 0    Change in appetite 0 1 0    Feeling bad or failure about yourself  0 0 0    Trouble concentrating 0 0 0    Moving slowly or fidgety/restless 0 0 0    Suicidal thoughts 0 0 0    PHQ-9 Score 1 2 0    Has used melatonin for sleep in past - helped in past. No recent melatonin. Waking up few times per night. Occasional nocturia, not every time. Early awakening.  6-7 hours sleep per night, overall feels rested. No daytime naps. No snoring.  No worry or stress at night. On phone at night at times.   Health Maintenance  Topic Date Due   DTaP/Tdap/Td (2 - Td or Tdap) 02/14/2022   COVID-19 Vaccine (3 - 2023-24 season) 10/01/2022 (Originally 04/22/2022)   INFLUENZA VACCINE  11/20/2022 (Originally 03/22/2022)   Zoster Vaccines- Shingrix (2 of 2) 12/15/2022 (Originally 08/31/2019)   COLONOSCOPY (Pts 45-64yr Insurance coverage will need to be confirmed)  08/04/2027   Pneumonia Vaccine 67 Years old  Completed   Hepatitis C Screening  Completed   HPV VACCINES  Aged Out  Colonoscopy 08/03/2017, repeat in 5 to 7 years, one 4 mm polyp - tubular adenoma in the cecum.has not repeated or received call to repeat.  Prostate: does not have family history of prostate cancer The natural history of prostate cancer and ongoing controversy regarding screening and potential treatment outcomes of prostate cancer has been discussed with the patient. The meaning of a false positive PSA and a false negative PSA has been discussed. He indicates understanding of the limitations of this screening test and wishes to  proceed with screening PSA testing. Lab Results  Component Value Date   PSA1 0.7 06/10/2020   PSA1 0.7 05/30/2019   PSA 0.64 07/21/2021   PSA 0.5 04/26/2016   PSA 1.46 04/14/2015    Immunization History  Administered Date(s) Administered   Fluad Quad(high Dose 65+) 06/10/2020   Influenza,inj,Quad PF,6+ Mos 05/09/2018, 05/30/2019   Influenza-Unspecified 05/21/2014   PFIZER(Purple Top)SARS-COV-2 Vaccination 10/26/2019, 11/16/2019   PNEUMOCOCCAL CONJUGATE-20 02/15/2021   Tdap 02/15/2012   Zoster Recombinat (Shingrix) 07/06/2019   Zoster, Live 03/22/2014  Shingrix - [facility] - will check to see if 2nd one given.  Flu vaccine at work last fall.  Covid booster - declines. Covid infection last year.   No results found. Wears glasses. Optho visit last month. Beginning of a cataract in one eye. Early.   Dental: last July - every 6 months.   Alcohol: rare beer every few weeks.   Tobacco: none.  Prediabetes: Lab Results  Component Value Date   HGBA1C 6.3 07/21/2021   Wt Readings from Last 3 Encounters:  09/15/22 183 lb 9.6 oz (83.3 kg)  07/21/21 193 lb 9.6 oz (87.8 kg)  02/15/21 186 lb 6.4 oz (84.6 kg)  Exercise: minimal - plans to increase. FITT discussed with 1559m. Weight is better than last physical.    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 No pertinent past medical history. Past Surgical History:  Procedure Laterality Date   VASECTOMY     No Known Allergies Prior to Admission medications   Medication Sig Start Date End Date Taking? Authorizing Provider  clobetasol cream (TEMOVATE) 0.5.68 Apply 1 application topically 2 (two)  times daily. 04/07/14  Yes DaDarlyne RussianMD  levothyroxine (SYNTHROID) 75 MCG tablet TAKE ONE TABLET BY MOUTH ONCE DAILY BEFORE  BREAKFAST 08/31/22  Yes GrWendie AgresteMD  Multiple Vitamins-Minerals (CENTRUM SILVER ADULT 50+) TABS Take by mouth.   Yes [provider]  Triamcinolone Acetonide (TRIAMCINOLONE 0.1 % CREAM : EUCERIN) CREA Apply 1 application topically 3 (three) times daily. 07/10/14  Yes DaDarlyne RussianMD  meloxicam (MOBIC) 7.5 MG tablet Take 1 tablet (7.5 mg total) by mouth daily. Patient not taking: Reported on 09/15/2022  07/21/21   Wendie Agreste, MD   Social History   Socioeconomic History   Marital status: Divorced    Spouse name: Not on file   Number of children: Not on file   Years of education: Not on file   Highest education level: Not on file  Occupational History   Occupation: Accountant    Employer: [employer]  Tobacco Use   Smoking status: Never   Smokeless tobacco: Never  Vaping Use   Vaping Use: Never used  Substance and Sexual Activity   Alcohol use: No   Drug use: No   Sexual activity: Not Currently  Other Topics Concern   Not on file  Social History Narrative   Divorced. Education: [employer]. Exercise: [employer] 3 times a week for 30 minutes.   Social Determinants of Health   Financial Resource Strain: Not on file  Food Insecurity: Not on file  Transportation Needs: Not on file  Physical Activity: Not on file  Stress: Not on file  Social Connections: Not on file  Intimate Partner Violence: Not on file    Review of Systems  13 point review of systems per patient health survey noted.  Negative other than as indicated above or in HPI.   Objective:   Vitals:   09/15/22 0915  BP: 130/76  Pulse: 71  SpO2: 96%  Weight: 183 lb 9.6 oz (83.3 kg)  Height: 5' 8.25" (1.734 m)     Physical Exam Vitals reviewed.  Constitutional:      Appearance: He is well-developed.  HENT:     Head: Normocephalic and atraumatic.      Right Ear: External ear normal.     Left Ear: External ear normal.  Eyes:     Conjunctiva/sclera: Conjunctivae normal.     Pupils: Pupils are equal, round, and reactive to light.  Neck:     Thyroid: No thyromegaly.  Cardiovascular:     Rate and Rhythm: Normal rate and regular rhythm.     Heart sounds: Normal heart sounds.  Pulmonary:     Effort: Pulmonary effort is normal. No respiratory distress.     Breath sounds: Normal breath sounds. No wheezing.  Abdominal:     General: There is no distension.     Palpations: Abdomen is soft.     Tenderness: There is no abdominal tenderness.  Musculoskeletal:        General: No tenderness. Normal range of motion.     Cervical back: Normal range of motion and neck supple.  Lymphadenopathy:     Cervical: No cervical adenopathy.  Skin:    General: Skin is warm and dry.  Neurological:     Mental Status: He is alert and oriented to person, place, and time.     Deep Tendon Reflexes: Reflexes are normal and symmetric.  Psychiatric:        Behavior: Behavior normal.        Assessment & Plan:  Skipper Dacosta is a 67 y.o. male . Annual physical exam  - -anticipatory guidance as below in AVS, screening labs above. Health maintenance items as above in HPI discussed/recommended as applicable.   Screening for diabetes mellitus - Plan: Comprehensive metabolic panel, Hemoglobin A1c Prediabetes - Plan: Hemoglobin A1c  -Commended on weight loss.  Restart exercise, check A1c.  If still at prediabetes level, may need to recheck in 6 months instead of a year for physical.  Screening for prostate cancer - Plan: PSA, Medicare  Hyperlipidemia, unspecified hyperlipidemia type - Plan:  Comprehensive metabolic panel, Lipid panel  -No current meds, elevated ASCVD risk were previously.  Diet/exercise approach planned, has had some weight loss as above.  Check updated labs to determine plan or medication recommendations.  Hypothyroidism, unspecified type -  Plan: levothyroxine (SYNTHROID) 75 MCG tablet, TSH  -  Stable, tolerating current regimen. Medications refilled. Labs pending as above.   Insomnia, unspecified type  -Sleep hygiene discussed, noise machine may be helpful at night, avoidance of electronic media, okay to try melatonin, RTC precautions if persistent or worsening.  Could consider sleep specialist eval as well.  Meds ordered this encounter  Medications   levothyroxine (SYNTHROID) 75 MCG tablet    Sig: TAKE ONE TABLET BY MOUTH ONCE DAILY BEFORE  BREAKFAST    Dispense:  90 tablet    Refill:  3   Patient Instructions  Okay to try melatonin again, see information below on insomnia.  Avoid use of electronic devices such as bone during the night as that could worsen difficulty with sleep or getting back to sleep.  Noise machine may also be helpful.  Please follow-up if sleep does not improve or any new or worsening symptoms.  If any concerns on labs I will let you know.  No med changes for now.  Follow-up with [facility] to see if you did have the shingles vaccine, both doses.  If not, can have a second dose through [facility].  Please send me documentation of your last flu vaccine if you can find it.  Follow-up in 1 year for physical but let me know if there are concerns sooner.  Take care!  Preventive Care 15 Years and Older, Male Preventive care refers to lifestyle choices and visits with your health care provider that can promote health and wellness. Preventive care visits are also called wellness exams. What can I expect for my preventive care visit? Counseling During your preventive care visit, your health care provider may ask about your: Medical history, including: Past medical problems. Family medical history. History of falls. Current health, including: Emotional well-being. Home life and relationship well-being. Sexual activity. Memory and ability to understand (cognition). Lifestyle, including: Alcohol, nicotine or  tobacco, and drug use. Access to firearms. Diet, exercise, and sleep habits. Work and work Statistician. Sunscreen use. Safety issues such as seatbelt and bike helmet use. Physical exam Your health care provider will check your: Height and weight. These may be used to calculate your BMI (body mass index). BMI is a measurement that tells if you are at a healthy weight. Waist circumference. This measures the distance around your waistline. This measurement also tells if you are at a healthy weight and may help predict your risk of certain diseases, such as type 2 diabetes and high blood pressure. Heart rate and blood pressure. Body temperature. Skin for abnormal spots. What immunizations do I need?  Vaccines are usually given at various ages, according to a schedule. Your health care provider will recommend vaccines for you based on your age, medical history, and lifestyle or other factors, such as travel or where you work. What tests do I need? Screening Your health care provider may recommend screening tests for certain conditions. This may include: Lipid and cholesterol levels. Diabetes screening. This is done by checking your blood sugar (glucose) after you have not eaten for a while (fasting). Hepatitis C test. Hepatitis B test. HIV (human immunodeficiency virus) test. STI (sexually transmitted infection) testing, if you are at risk. Lung cancer screening. Colorectal cancer screening. Prostate cancer screening.  Abdominal aortic aneurysm (AAA) screening. You may need this if you are a current or former smoker. Talk with your health care provider about your test results, treatment options, and if necessary, the need for more tests. Follow these instructions at home: Eating and drinking  Eat a diet that includes fresh fruits and vegetables, whole grains, lean protein, and low-fat dairy products. Limit your intake of foods with high amounts of sugar, saturated fats, and salt. Take  vitamin and mineral supplements as recommended by your health care provider. Do not drink alcohol if your health care provider tells you not to drink. If you drink alcohol: Limit how much you have to 0-2 drinks a day. Know how much alcohol is in your drink. In the [place] one drink equals one 12 oz bottle of beer (355 mL), one 5 oz glass of wine (148 mL), or one 1 oz glass of hard liquor (44 mL). Lifestyle Brush your teeth every morning and night with fluoride toothpaste. Floss one time each day. Exercise for at least 30 minutes 5 or more days each week. Do not use any products that contain nicotine or tobacco. These products include cigarettes, chewing tobacco, and vaping devices, such as e-cigarettes. If you need help quitting, ask your health care provider. Do not use drugs. If you are sexually active, practice safe sex. Use a condom or other form of protection to prevent STIs. Take aspirin only as told by your health care provider. Make sure that you understand how much to take and what form to take. Work with your health care provider to find out whether it is safe and beneficial for you to take aspirin daily. Ask your health care provider if you need to take a cholesterol-lowering medicine (statin). Find healthy ways to manage stress, such as: Meditation, yoga, or listening to music. Journaling. Talking to a trusted person. Spending time with friends and family. Safety Always wear your seat belt while driving or riding in a vehicle. Do not drive: If you have been drinking alcohol. Do not ride with someone who has been drinking. When you are tired or distracted. While texting. If you have been using any mind-altering substances or drugs. Wear a helmet and other protective equipment during sports activities. If you have firearms in your house, make sure you follow all gun safety procedures. Minimize exposure to UV radiation to reduce your risk of skin cancer. What's next? Visit your  health care provider once a year for an annual wellness visit. Ask your health care provider how often you should have your eyes and teeth checked. Stay up to date on all vaccines. This information is not intended to replace advice given to you by your health care provider. Make sure you discuss any questions you have with your health care provider. Document Revised: 02/03/2021 Document Reviewed: 02/03/2021 Elsevier Patient Education  [place].   Insomnia  Insomnia is a sleep disorder that makes it difficult to fall asleep or stay asleep. Insomnia can cause fatigue, low energy, difficulty concentrating, mood swings, and poor performance at work or school. There are three different ways to classify insomnia: Difficulty falling asleep. Difficulty staying asleep. Waking up too early in the morning. Any type of insomnia can be long-term (chronic) or short-term (acute). Both are common. Short-term insomnia usually lasts for 3 months or less. Chronic insomnia occurs at least three times a week for longer than 3 months. What are the causes? Insomnia may be caused by another condition, situation, or substance,  such as: Having certain mental health conditions, such as anxiety and depression. Using caffeine, alcohol, tobacco, or drugs. Having gastrointestinal conditions, such as gastroesophageal reflux disease (GERD). Having certain medical conditions. These include: Asthma. Alzheimer's disease. Stroke. Chronic pain. An overactive thyroid gland (hyperthyroidism). Other sleep disorders, such as restless legs syndrome and sleep apnea. Menopause. Sometimes, the cause of insomnia may not be known. What increases the risk? Risk factors for insomnia include: Gender. Females are affected more often than males. Age. Insomnia is more common as people get older. Stress and certain medical and mental health conditions. Lack of exercise. Having an irregular work schedule. This may include  working night shifts and traveling between different time zones. What are the signs or symptoms? If you have insomnia, the main symptom is having trouble falling asleep or having trouble staying asleep. This may lead to other symptoms, such as: Feeling tired or having low energy. Feeling nervous about going to sleep. Not feeling rested in the morning. Having trouble concentrating. Feeling irritable, anxious, or depressed. How is this diagnosed? This condition may be diagnosed based on: Your symptoms and medical history. Your health care provider may ask about: Your sleep habits. Any medical conditions you have. Your mental health. A physical exam. How is this treated? Treatment for insomnia depends on the cause. Treatment may focus on treating an underlying condition that is causing the insomnia. Treatment may also include: Medicines to help you sleep. Counseling or therapy. Lifestyle adjustments to help you sleep better. Follow these instructions at home: Eating and drinking  Limit or avoid alcohol, caffeinated beverages, and products that contain nicotine and tobacco, especially close to bedtime. These can disrupt your sleep. Do not eat a large meal or eat spicy foods right before bedtime. This can lead to digestive discomfort that can make it hard for you to sleep. Sleep habits  Keep a sleep diary to help you and your health care provider figure out what could be causing your insomnia. Write down: When you sleep. When you wake up during the night. How well you sleep and how rested you feel the next day. Any side effects of medicines you are taking. What you eat and drink. Make your bedroom a dark, comfortable place where it is easy to fall asleep. Put up shades or blackout curtains to block light from outside. Use a white noise machine to block noise. Keep the temperature cool. Limit screen use before bedtime. This includes: Not watching TV. Not using your smartphone,  tablet, or computer. Stick to a routine that includes going to bed and waking up at the same times every day and night. This can help you fall asleep faster. Consider making a quiet activity, such as reading, part of your nighttime routine. Try to avoid taking naps during the day so that you sleep better at night. Get out of bed if you are still awake after 15 minutes of trying to sleep. Keep the lights down, but try reading or doing a quiet activity. When you feel sleepy, go back to bed. General instructions Take over-the-counter and prescription medicines only as told by your health care provider. Exercise regularly as told by your health care provider. However, avoid exercising in the hours right before bedtime. Use relaxation techniques to manage stress. Ask your health care provider to suggest some techniques that may work well for you. These may include: Breathing exercises. Routines to release muscle tension. Visualizing peaceful scenes. Make sure that you drive carefully. Do not drive if you  feel very sleepy. Keep all follow-up visits. This is important. Contact a health care provider if: You are tired throughout the day. You have trouble in your daily routine due to sleepiness. You continue to have sleep problems, or your sleep problems get worse. Get help right away if: You have thoughts about hurting yourself or someone else. Get help right away if you feel like you may hurt yourself or others, or have thoughts about taking your own life. Go to your nearest emergency room or: Call 911. Call the [facility] at [phone] or 988. This is open 24 hours a day. Text the Crisis Text Line at (431)077-8498. Summary Insomnia is a sleep disorder that makes it difficult to fall asleep or stay asleep. Insomnia can be long-term (chronic) or short-term (acute). Treatment for insomnia depends on the cause. Treatment may focus on treating an underlying condition that is  causing the insomnia. Keep a sleep diary to help you and your health care provider figure out what could be causing your insomnia. This information is not intended to replace advice given to you by your health care provider. Make sure you discuss any questions you have with your health care provider. Document Revised: 07/19/2021 Document Reviewed: 07/19/2021 Elsevier Patient Education  2023 [place],   [name], MD [place] 09/15/22 9:49 AM

## 2022-09-15 NOTE — Patient Instructions (Signed)
Okay to try melatonin again, see information below on insomnia.  Avoid use of electronic devices such as bone during the night as that could worsen difficulty with sleep or getting back to sleep.  Noise machine may also be helpful.  Please follow-up if sleep does not improve or any new or worsening symptoms.  If any concerns on labs I will let you know.  No med changes for now.  Follow-up with [facility] to see if you did have the shingles vaccine, both doses.  If not, can have a second dose through [facility].  Please send me documentation of your last flu vaccine if you can find it.  Follow-up in 1 year for physical but let me know if there are concerns sooner.  Take care!  Preventive Care 76 Years and Older, Male Preventive care refers to lifestyle choices and visits with your health care provider that can promote health and wellness. Preventive care visits are also called wellness exams. What can I expect for my preventive care visit? Counseling During your preventive care visit, your health care provider may ask about your: Medical history, including: Past medical problems. Family medical history. History of falls. Current health, including: Emotional well-being. Home life and relationship well-being. Sexual activity. Memory and ability to understand (cognition). Lifestyle, including: Alcohol, nicotine or tobacco, and drug use. Access to firearms. Diet, exercise, and sleep habits. Work and work Statistician. Sunscreen use. Safety issues such as seatbelt and bike helmet use. Physical exam Your health care provider will check your: Height and weight. These may be used to calculate your BMI (body mass index). BMI is a measurement that tells if you are at a healthy weight. Waist circumference. This measures the distance around your waistline. This measurement also tells if you are at a healthy weight and may help predict your risk of certain diseases, such as type 2 diabetes and high blood  pressure. Heart rate and blood pressure. Body temperature. Skin for abnormal spots. What immunizations do I need?  Vaccines are usually given at various ages, according to a schedule. Your health care provider will recommend vaccines for you based on your age, medical history, and lifestyle or other factors, such as travel or where you work. What tests do I need? Screening Your health care provider may recommend screening tests for certain conditions. This may include: Lipid and cholesterol levels. Diabetes screening. This is done by checking your blood sugar (glucose) after you have not eaten for a while (fasting). Hepatitis C test. Hepatitis B test. HIV (human immunodeficiency virus) test. STI (sexually transmitted infection) testing, if you are at risk. Lung cancer screening. Colorectal cancer screening. Prostate cancer screening. Abdominal aortic aneurysm (AAA) screening. You may need this if you are a current or former smoker. Talk with your health care provider about your test results, treatment options, and if necessary, the need for more tests. Follow these instructions at home: Eating and drinking  Eat a diet that includes fresh fruits and vegetables, whole grains, lean protein, and low-fat dairy products. Limit your intake of foods with high amounts of sugar, saturated fats, and salt. Take vitamin and mineral supplements as recommended by your health care provider. Do not drink alcohol if your health care provider tells you not to drink. If you drink alcohol: Limit how much you have to 0-2 drinks a day. Know how much alcohol is in your drink. In the [place] one drink equals one 12 oz bottle of beer (355 mL), one 5 oz glass of wine (  148 mL), or one 1 oz glass of hard liquor (44 mL). Lifestyle Brush your teeth every morning and night with fluoride toothpaste. Floss one time each day. Exercise for at least 30 minutes 5 or more days each week. Do not use any products that  contain nicotine or tobacco. These products include cigarettes, chewing tobacco, and vaping devices, such as e-cigarettes. If you need help quitting, ask your health care provider. Do not use drugs. If you are sexually active, practice safe sex. Use a condom or other form of protection to prevent STIs. Take aspirin only as told by your health care provider. Make sure that you understand how much to take and what form to take. Work with your health care provider to find out whether it is safe and beneficial for you to take aspirin daily. Ask your health care provider if you need to take a cholesterol-lowering medicine (statin). Find healthy ways to manage stress, such as: Meditation, yoga, or listening to music. Journaling. Talking to a trusted person. Spending time with friends and family. Safety Always wear your seat belt while driving or riding in a vehicle. Do not drive: If you have been drinking alcohol. Do not ride with someone who has been drinking. When you are tired or distracted. While texting. If you have been using any mind-altering substances or drugs. Wear a helmet and other protective equipment during sports activities. If you have firearms in your house, make sure you follow all gun safety procedures. Minimize exposure to UV radiation to reduce your risk of skin cancer. What's next? Visit your health care provider once a year for an annual wellness visit. Ask your health care provider how often you should have your eyes and teeth checked. Stay up to date on all vaccines. This information is not intended to replace advice given to you by your health care provider. Make sure you discuss any questions you have with your health care provider. Document Revised: 02/03/2021 Document Reviewed: 02/03/2021 Elsevier Patient Education  [place].   Insomnia  Insomnia is a sleep disorder that makes it difficult to fall asleep or stay asleep. Insomnia can cause fatigue, low  energy, difficulty concentrating, mood swings, and poor performance at work or school. There are three different ways to classify insomnia: Difficulty falling asleep. Difficulty staying asleep. Waking up too early in the morning. Any type of insomnia can be long-term (chronic) or short-term (acute). Both are common. Short-term insomnia usually lasts for 3 months or less. Chronic insomnia occurs at least three times a week for longer than 3 months. What are the causes? Insomnia may be caused by another condition, situation, or substance, such as: Having certain mental health conditions, such as anxiety and depression. Using caffeine, alcohol, tobacco, or drugs. Having gastrointestinal conditions, such as gastroesophageal reflux disease (GERD). Having certain medical conditions. These include: Asthma. Alzheimer's disease. Stroke. Chronic pain. An overactive thyroid gland (hyperthyroidism). Other sleep disorders, such as restless legs syndrome and sleep apnea. Menopause. Sometimes, the cause of insomnia may not be known. What increases the risk? Risk factors for insomnia include: Gender. Females are affected more often than males. Age. Insomnia is more common as people get older. Stress and certain medical and mental health conditions. Lack of exercise. Having an irregular work schedule. This may include working night shifts and traveling between different time zones. What are the signs or symptoms? If you have insomnia, the main symptom is having trouble falling asleep or having trouble staying asleep. This may lead  to other symptoms, such as: Feeling tired or having low energy. Feeling nervous about going to sleep. Not feeling rested in the morning. Having trouble concentrating. Feeling irritable, anxious, or depressed. How is this diagnosed? This condition may be diagnosed based on: Your symptoms and medical history. Your health care provider may ask about: Your sleep  habits. Any medical conditions you have. Your mental health. A physical exam. How is this treated? Treatment for insomnia depends on the cause. Treatment may focus on treating an underlying condition that is causing the insomnia. Treatment may also include: Medicines to help you sleep. Counseling or therapy. Lifestyle adjustments to help you sleep better. Follow these instructions at home: Eating and drinking  Limit or avoid alcohol, caffeinated beverages, and products that contain nicotine and tobacco, especially close to bedtime. These can disrupt your sleep. Do not eat a large meal or eat spicy foods right before bedtime. This can lead to digestive discomfort that can make it hard for you to sleep. Sleep habits  Keep a sleep diary to help you and your health care provider figure out what could be causing your insomnia. Write down: When you sleep. When you wake up during the night. How well you sleep and how rested you feel the next day. Any side effects of medicines you are taking. What you eat and drink. Make your bedroom a dark, comfortable place where it is easy to fall asleep. Put up shades or blackout curtains to block light from outside. Use a white noise machine to block noise. Keep the temperature cool. Limit screen use before bedtime. This includes: Not watching TV. Not using your smartphone, tablet, or computer. Stick to a routine that includes going to bed and waking up at the same times every day and night. This can help you fall asleep faster. Consider making a quiet activity, such as reading, part of your nighttime routine. Try to avoid taking naps during the day so that you sleep better at night. Get out of bed if you are still awake after 15 minutes of trying to sleep. Keep the lights down, but try reading or doing a quiet activity. When you feel sleepy, go back to bed. General instructions Take over-the-counter and prescription medicines only as told by your  health care provider. Exercise regularly as told by your health care provider. However, avoid exercising in the hours right before bedtime. Use relaxation techniques to manage stress. Ask your health care provider to suggest some techniques that may work well for you. These may include: Breathing exercises. Routines to release muscle tension. Visualizing peaceful scenes. Make sure that you drive carefully. Do not drive if you feel very sleepy. Keep all follow-up visits. This is important. Contact a health care provider if: You are tired throughout the day. You have trouble in your daily routine due to sleepiness. You continue to have sleep problems, or your sleep problems get worse. Get help right away if: You have thoughts about hurting yourself or someone else. Get help right away if you feel like you may hurt yourself or others, or have thoughts about taking your own life. Go to your nearest emergency room or: Call 911. Call the [facility] at 912-633-1274 or 988. This is open 24 hours a day. Text the Crisis Text Line at 515-661-9342. Summary Insomnia is a sleep disorder that makes it difficult to fall asleep or stay asleep. Insomnia can be long-term (chronic) or short-term (acute). Treatment for insomnia depends on the cause. Treatment  may focus on treating an underlying condition that is causing the insomnia. Keep a sleep diary to help you and your health care provider figure out what could be causing your insomnia. This information is not intended to replace advice given to you by your health care provider. Make sure you discuss any questions you have with your health care provider. Document Revised: 07/19/2021 Document Reviewed: 07/19/2021 Elsevier Patient Education  [place].

## 2022-09-25 ENCOUNTER — Encounter: Payer: Self-pay | Admitting: Family Medicine

## 2022-09-25 DIAGNOSIS — E785 Hyperlipidemia, unspecified: Secondary | ICD-10-CM

## 2022-09-25 DIAGNOSIS — E039 Hypothyroidism, unspecified: Secondary | ICD-10-CM

## 2022-09-26 NOTE — Telephone Encounter (Signed)
Pt is agreeable to med changes

## 2022-09-27 MED ORDER — ATORVASTATIN CALCIUM 10 MG PO TABS: 10.0000 mg | ORAL_TABLET | Freq: Every day | ORAL | 0 refills | Status: DC

## 2022-09-27 MED ORDER — LEVOTHYROXINE SODIUM 88 MCG PO TABS: 88.0000 ug | ORAL_TABLET | Freq: Every day | ORAL | 1 refills | Status: DC

## 2022-12-23 ENCOUNTER — Encounter: Payer: Self-pay | Admitting: Family Medicine

## 2022-12-28 ENCOUNTER — Encounter: Payer: Self-pay | Admitting: Family Medicine

## 2022-12-28 ENCOUNTER — Ambulatory Visit: Payer: 59 | Admitting: Family Medicine

## 2022-12-28 VITALS — BP 110/72 | HR 70 | Temp 98.4°F | Ht 68.0 in | Wt 181.0 lb

## 2022-12-28 DIAGNOSIS — E039 Hypothyroidism, unspecified: Secondary | ICD-10-CM

## 2022-12-28 DIAGNOSIS — E785 Hyperlipidemia, unspecified: Secondary | ICD-10-CM

## 2022-12-28 DIAGNOSIS — R7303 Prediabetes: Secondary | ICD-10-CM

## 2022-12-28 LAB — COMPREHENSIVE METABOLIC PANEL WITH GFR
ALT: 23 U/L (ref 0–53)
AST: 24 U/L (ref 0–37)
Albumin: 4.6 g/dL (ref 3.5–5.2)
Alkaline Phosphatase: 87 U/L (ref 39–117)
BUN: 11 mg/dL (ref 6–23)
CO2: 30 meq/L (ref 19–32)
Calcium: 9.9 mg/dL (ref 8.4–10.5)
Chloride: 101 meq/L (ref 96–112)
Creatinine, Ser: 0.9 mg/dL (ref 0.40–1.50)
GFR: 87.84 mL/min
Glucose, Bld: 94 mg/dL (ref 70–99)
Potassium: 4.7 meq/L (ref 3.5–5.1)
Sodium: 138 meq/L (ref 135–145)
Total Bilirubin: 1.4 mg/dL — ABNORMAL HIGH (ref 0.2–1.2)
Total Protein: 7.8 g/dL (ref 6.0–8.3)

## 2022-12-28 LAB — LIPID PANEL
Cholesterol: 115 mg/dL (ref 0–200)
HDL: 51 mg/dL (ref >39.00)
LDL Cholesterol: 45 mg/dL (ref 0–99)
NonHDL: 63.66
Total CHOL/HDL Ratio: 2
Triglycerides: 93 mg/dL (ref 0.0–149.0)
VLDL: 18.6 mg/dL (ref 0.0–40.0)

## 2022-12-28 LAB — TSH: TSH: 3.78 u[IU]/mL (ref 0.35–5.50)

## 2022-12-28 LAB — HEMOGLOBIN A1C: Hgb A1c MFr Bld: 6.3 % (ref 4.6–6.5)

## 2022-12-28 MED ORDER — ATORVASTATIN CALCIUM 10 MG PO TABS: 10.0000 mg | ORAL_TABLET | Freq: Every day | ORAL | 2 refills | Status: DC

## 2022-12-28 MED ORDER — LEVOTHYROXINE SODIUM 88 MCG PO TABS: 88.0000 ug | ORAL_TABLET | Freq: Every day | ORAL | 2 refills | Status: DC

## 2022-12-28 NOTE — Progress Notes (Signed)
Subjective:  Patient ID: Jeffery Clements, male    DOB: Jul 26, 1956  Age: 67 y.o. MRN: [id number]  CC:  Chief Complaint  Patient presents with   Medical Management of Chronic Issues    No concerns     HPI [name] presents for  Follow up. No health changes.   Hypothyroidism: Lab Results  Component Value Date   TSH 7.01 (H) 09/15/2022  Taking medication daily.  Borderline TSH in January.  Synthroid 88 mcg daily, increased form in January.  No new hot or cold intolerance. No new hair or skin changes, heart palpitations or new fatigue. No new weight changes.   Hyperlipidemia: With elevated ASCVD risk score in January, 16.4%.  Started on Lipitor 10 mg daily, taking daily. No new side effects. No myalgias.  Last ate orange juice 2 hrs ago.  Lab Results  Component Value Date   CHOL 222 (H) 09/15/2022   HDL 54.70 09/15/2022   LDLCALC 128 (H) 09/15/2022   TRIG 195.0 (H) 09/15/2022   CHOLHDL 4 09/15/2022   Lab Results  Component Value Date   ALT 17 09/15/2022   AST 17 09/15/2022   ALKPHOS 85 09/15/2022   BILITOT 1.1 09/15/2022   Prediabetes: Diet/exercise approach, weight has decreased 2 pounds from last visit, and was down 10 pounds at that time. Some bowflex, exercise.  Lab Results  Component Value Date   HGBA1C 6.2 09/15/2022   Wt Readings from Last 3 Encounters:  12/28/22 181 lb (82.1 kg)  09/15/22 183 lb 9.6 oz (83.3 kg)  07/21/21 193 lb 9.6 oz (87.8 kg)   HM Tdap planned at pharmacy Had second shingrix at pharmacy.    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 No pertinent past medical history. Past Surgical  History:  Procedure Laterality Date   VASECTOMY     No Known Allergies Prior to Admission medications   Medication Sig Start Date End Date Taking? Authorizing Provider  atorvastatin (LIPITOR) 10 MG tablet Take 1 tablet (10 mg total) by mouth daily. 09/27/22  Yes Shade Flood, MD  clobetasol cream (TEMOVATE) 0.05 % Apply 1 application topically 2 (two) times daily. 04/07/14  Yes Collene Gobble, MD  levothyroxine (SYNTHROID) 88 MCG tablet Take 1 tablet (88 mcg total) by mouth daily before breakfast. 09/27/22  Yes Shade Flood, MD  Multiple Vitamins-Minerals (CENTRUM SILVER ADULT 50+) TABS Take by mouth.   Yes [provider]  Triamcinolone Acetonide (TRIAMCINOLONE 0.1 % CREAM : EUCERIN) CREA Apply 1 application topically 3 (three) times daily. 07/10/14  Yes Collene Gobble, MD  meloxicam (MOBIC) 7.5 MG tablet Take 1 tablet (7.5 mg total) by mouth daily. Patient not taking: Reported on 09/15/2022 07/21/21   Shade Flood, MD   Social History   Socioeconomic History   Marital status: Divorced    Spouse name: Not on file   Number of children: Not on file   Years of education: Not on file   Highest education level: Not on file  Occupational History   Occupation: Accountant    Employer: [employer]  Tobacco Use   Smoking status: Never   Smokeless tobacco: Never  Vaping Use   Vaping Use: Never used  Substance and Sexual Activity   Alcohol use: No   Drug use: No   Sexual activity: Not Currently  Other Topics Concern   Not on file  Social History Narrative   Divorced. Education: [employer]. Exercise: Bouflex 3 times a week for 30 minutes.   Social Determinants of Health   Financial Resource Strain: Not on file  Food Insecurity: Not on file  Transportation Needs: Not on file  Physical Activity: Not on file  Stress: Not on file  Social Connections: Not on file  Intimate Partner Violence: Not on file    Review of Systems   Objective:   Vitals:    12/28/22 0840  BP: 110/72  Pulse: 70  Temp: 98.4 F (36.9 C)  TempSrc: Temporal  SpO2: 95%  Weight: 181 lb (82.1 kg)  Height: 5\' 8"  (1.727 m)     Physical Exam Vitals reviewed.  Constitutional:      Appearance: He is well-developed.  HENT:     Head: Normocephalic and atraumatic.  Neck:     Vascular: No carotid bruit or JVD.  Cardiovascular:     Rate and Rhythm: Normal rate and regular rhythm.     Heart sounds: Normal heart sounds. No murmur heard. Pulmonary:     Effort: Pulmonary effort is normal.     Breath sounds: Normal breath sounds. No rales.  Musculoskeletal:     Right lower leg: No edema.     Left lower leg: No edema.  Skin:    General: Skin is warm and dry.  Neurological:     Mental Status: He is alert and oriented to person, place, and time.  Psychiatric:        Mood and Affect: Mood normal.        Assessment & Plan:  [name] is a 67 y.o. male . Hypothyroidism, unspecified type - Plan: TSH, levothyroxine (SYNTHROID) 88 MCG tablet  - Stable, tolerating current regimen. Medications refilled. Labs pending as above.   Hyperlipidemia, unspecified hyperlipidemia type - Plan: Comprehensive metabolic panel, Lipid panel, atorvastatin (LIPITOR) 10 MG tablet  -  Stable, tolerating statin. Medications refilled. Labs pending as above.   Prediabetes - Plan: Hemoglobin A1c Commended on weight loss.  Check A1c.  43-month follow-up for physical   Health maintenance Recommended Tdap through pharmacy.  He has received his second Shingrix through pharmacy.  Meds ordered this encounter  Medications   atorvastatin (LIPITOR) 10 MG tablet    Sig: Take 1 tablet (10 mg total) by mouth daily.    Dispense:  90 tablet    Refill:  2   levothyroxine (SYNTHROID) 88 MCG tablet    Sig: Take 1 tablet (88 mcg total) by mouth daily before breakfast.    Dispense:  90 tablet    Refill:  2   Patient Instructions  No medication changes today, I will let you know if there  are any concerns on labs.  Let me know if there are questions.  Take care.       Signed,   Meredith Staggers, MD [facility], [facility] 12/28/22 9:31 AM

## 2022-12-28 NOTE — Patient Instructions (Signed)
No medication changes today, I will let you know if there are any concerns on labs.  Let me know if there are questions.  Take care.

## 2023-02-24 ENCOUNTER — Telehealth: Payer: Self-pay

## 2023-02-24 NOTE — Telephone Encounter (Signed)
Left pt a vm about lab results and sent them via my chart

## 2023-09-19 ENCOUNTER — Other Ambulatory Visit: Payer: Self-pay | Admitting: Family Medicine

## 2023-09-19 DIAGNOSIS — E785 Hyperlipidemia, unspecified: Secondary | ICD-10-CM

## 2023-09-20 ENCOUNTER — Ambulatory Visit: Payer: Managed Care, Other (non HMO) | Admitting: Family Medicine

## 2023-09-20 ENCOUNTER — Encounter: Payer: Self-pay | Admitting: Family Medicine

## 2023-09-20 VITALS — BP 110/68 | HR 67 | Temp 98.3°F | Ht 68.75 in | Wt 186.0 lb

## 2023-09-20 DIAGNOSIS — K5900 Constipation, unspecified: Secondary | ICD-10-CM

## 2023-09-20 DIAGNOSIS — Z0000 Encounter for general adult medical examination without abnormal findings: Secondary | ICD-10-CM | POA: Diagnosis not present

## 2023-09-20 DIAGNOSIS — Z125 Encounter for screening for malignant neoplasm of prostate: Secondary | ICD-10-CM | POA: Diagnosis not present

## 2023-09-20 DIAGNOSIS — E785 Hyperlipidemia, unspecified: Secondary | ICD-10-CM

## 2023-09-20 DIAGNOSIS — Z23 Encounter for immunization: Secondary | ICD-10-CM

## 2023-09-20 DIAGNOSIS — K625 Hemorrhage of anus and rectum: Secondary | ICD-10-CM

## 2023-09-20 DIAGNOSIS — E039 Hypothyroidism, unspecified: Secondary | ICD-10-CM

## 2023-09-20 DIAGNOSIS — L309 Dermatitis, unspecified: Secondary | ICD-10-CM

## 2023-09-20 DIAGNOSIS — R7303 Prediabetes: Secondary | ICD-10-CM

## 2023-09-20 DIAGNOSIS — Z1211 Encounter for screening for malignant neoplasm of colon: Secondary | ICD-10-CM | POA: Insufficient documentation

## 2023-09-20 LAB — LIPID PANEL
Cholesterol: 135 mg/dL (ref 0–200)
HDL: 57.6 mg/dL (ref >39.00)
LDL Cholesterol: 54 mg/dL (ref 0–99)
NonHDL: 77.86
Total CHOL/HDL Ratio: 2
Triglycerides: 121 mg/dL (ref 0.0–149.0)
VLDL: 24.2 mg/dL (ref 0.0–40.0)

## 2023-09-20 LAB — COMPREHENSIVE METABOLIC PANEL
ALT: 20 U/L (ref 0–53)
AST: 19 U/L (ref 0–37)
Albumin: 4.7 g/dL (ref 3.5–5.2)
Alkaline Phosphatase: 106 U/L (ref 39–117)
BUN: 14 mg/dL (ref 6–23)
CO2: 30 meq/L (ref 19–32)
Calcium: 10.7 mg/dL — ABNORMAL HIGH (ref 8.4–10.5)
Chloride: 101 meq/L (ref 96–112)
Creatinine, Ser: 0.97 mg/dL (ref 0.40–1.50)
GFR: 79.88 mL/min (ref >60.00)
Glucose, Bld: 100 mg/dL — ABNORMAL HIGH (ref 70–99)
Potassium: 4.9 meq/L (ref 3.5–5.1)
Sodium: 140 meq/L (ref 135–145)
Total Bilirubin: 1.5 mg/dL — ABNORMAL HIGH (ref 0.2–1.2)
Total Protein: 7.4 g/dL (ref 6.0–8.3)

## 2023-09-20 LAB — CBC WITH DIFFERENTIAL/PLATELET
Basophils Absolute: 0.1 10*3/uL (ref 0.0–0.1)
Basophils Relative: 1.2 % (ref 0.0–3.0)
Eosinophils Absolute: 0.2 10*3/uL (ref 0.0–0.7)
Eosinophils Relative: 2.7 % (ref 0.0–5.0)
HCT: 46.3 % (ref 39.0–52.0)
Hemoglobin: 15.4 g/dL (ref 13.0–17.0)
Lymphocytes Relative: 23.9 % (ref 12.0–46.0)
Lymphs Abs: 2 10*3/uL (ref 0.7–4.0)
MCHC: 33.3 g/dL (ref 30.0–36.0)
MCV: 88.6 fL (ref 78.0–100.0)
Monocytes Absolute: 0.8 10*3/uL (ref 0.1–1.0)
Monocytes Relative: 9.2 % (ref 3.0–12.0)
Neutro Abs: 5.2 10*3/uL (ref 1.4–7.7)
Neutrophils Relative %: 63 % (ref 43.0–77.0)
Platelets: 299 10*3/uL (ref 150.0–400.0)
RBC: 5.23 Mil/uL (ref 4.22–5.81)
RDW: 13 % (ref 11.5–15.5)
WBC: 8.3 10*3/uL (ref 4.0–10.5)

## 2023-09-20 LAB — TSH: TSH: 6.66 u[IU]/mL — ABNORMAL HIGH (ref 0.35–5.50)

## 2023-09-20 LAB — HEMOGLOBIN A1C: Hgb A1c MFr Bld: 6.5 % (ref 4.6–6.5)

## 2023-09-20 LAB — PSA, MEDICARE: PSA: 0.89 ng/mL (ref 0.10–4.00)

## 2023-09-20 MED ORDER — TRIAMCINOLONE 0.1 % CREAM:EUCERIN CREAM 1:1: 1.0000 | TOPICAL_CREAM | Freq: Three times a day (TID) | CUTANEOUS | 3 refills | Status: DC

## 2023-09-20 MED ORDER — LEVOTHYROXINE SODIUM 88 MCG PO TABS: 88.0000 ug | ORAL_TABLET | Freq: Every day | ORAL | 2 refills | Status: DC

## 2023-09-20 MED ORDER — ATORVASTATIN CALCIUM 10 MG PO TABS: 10.0000 mg | ORAL_TABLET | Freq: Every day | ORAL | 0 refills | Status: DC

## 2023-09-20 NOTE — Progress Notes (Unsigned)
Subjective:  Patient ID: Jeffery Clements, male    DOB: 02/24/1956  Age: 68 y.o. MRN: [id number]  CC:  Chief Complaint  Patient presents with   Annual Exam    Pt is doing well, pt is fasting     HPI Jeffery Clements presents for Annual Exam No health changes. Planned retirement at end of year.   Hyperlipidemia: Lipitor 10mg  every day. Started 1 year ago with ascvd risk score 16.4%.  No myalgias with daily dosing.  Lab Results  Component Value Date   CHOL 115 12/28/2022   HDL 51.00 12/28/2022   LDLCALC 45 12/28/2022   TRIG 93.0 12/28/2022   CHOLHDL 2 12/28/2022   Lab Results  Component Value Date   ALT 23 12/28/2022   AST 24 12/28/2022   ALKPHOS 87 12/28/2022   BILITOT 1.4 (H) 12/28/2022  No abd pain/n/v/scleral icterus/jaundice.   Hypothyroidism: Lab Results  Component Value Date   TSH 3.78 12/28/2022  Synthroid every day.  Taking medication daily.  No new hot or cold intolerance. No skin changes, possible more coarse hair past few months. No heart palpitations or new fatigue. No new weight changes.   Prediabetes: Diet/exercise approach. Weight up 5 #.  Exercise - minimal.  Diet - fast food 3 nights per week. Able to cut back.  Diet pepsi. No other sugar beverages.  Lab Results  Component Value Date   HGBA1C 6.3 12/28/2022   Wt Readings from Last 3 Encounters:  09/20/23 186 lb (84.4 kg)  12/28/22 181 lb (82.1 kg)  09/15/22 183 lb 9.6 oz (83.3 kg)    Has used triamcinolone 0.1% as needed for occasional itching rash.  Has seen derm once per year. Nevus on left shoulder/back area - past few months. Not sure derm has seen area. Next visit in November. Will call for sooner appt.      09/20/2023    9:41 AM 12/28/2022    8:41 AM 09/15/2022    9:12 AM 07/21/2021    9:15 AM 02/15/2021    1:29 PM  Depression screen PHQ 2/9  Decreased Interest 0 0 0 0 0  Down, Depressed, Hopeless 0 0 0 0 0  PHQ - 2 Score 0 0 0 0 0  Altered sleeping 0 2 1 1  0  Tired,  decreased energy 0 0 0 0 0  Change in appetite 0 0 0 1 0  Feeling bad or failure about yourself  0 0 0 0 0  Trouble concentrating 0 0 0 0 0  Moving slowly or fidgety/restless 0 0 0 0 0  Suicidal thoughts 0 0 0 0 0  PHQ-9 Score 0 2 1 2  0  Difficult doing work/chores  Not difficult at all       Health Maintenance  Topic Date Due   DTaP/Tdap/Td (2 - Td or Tdap) 02/14/2022   INFLUENZA VACCINE  03/23/2023   COVID-19 Vaccine (5 - 2024-25 season) 04/23/2023   Colonoscopy  08/04/2027   Pneumonia Vaccine 67+ Years old  Completed   Hepatitis C Screening  Completed   Zoster Vaccines- Shingrix  Completed   HPV VACCINES  Aged Out  Colonoscopy in 2018, repeat 10 yrs? Prior polyp removed in 2018.  Some bright red blood with wiping after straining past week or so with hard BM's and straining. Slight soreness at anus with straining. No abd pain.  Prostate: does not have family history of prostate cancer The natural history of prostate cancer and ongoing controversy regarding screening and potential  treatment outcomes of prostate cancer has been discussed with the patient. The meaning of a false positive PSA and a false negative PSA has been discussed. He indicates understanding of the limitations of this screening test and wishes to proceed with screening PSA testing, one additional time.   Lab Results  Component Value Date   PSA1 0.7 06/10/2020   PSA1 0.7 05/30/2019   PSA 0.79 09/15/2022   PSA 0.64 07/21/2021   PSA 0.5 04/26/2016      Immunization History  Administered Date(s) Administered   Fluad Quad(high Dose 65+) 06/10/2020   Influenza,inj,Quad PF,6+ Mos 05/09/2018, 05/30/2019   Influenza-Unspecified 05/21/2014   PFIZER(Purple Top)SARS-COV-2 Vaccination 10/26/2019, 11/16/2019, 08/07/2020, 06/14/2021   PNEUMOCOCCAL CONJUGATE-20 02/15/2021   Tdap 02/15/2012   Zoster Recombinant(Shingrix) 07/06/2019, 01/10/2020   Zoster, Live 03/22/2014  Flu vaccine today.  Covid booster - deferred.   Tdap, shingrix at pharmacy.   No results found. Optho - last visit 07/2023. Cataracts - not yet ready for repair.   Wears hearing aids - working ok - plans audiologist visit - few years ago.   Dental:Within Last 6 months  Alcohol: Rare - 1 per month on average.   Tobacco:  none  Exercise: minimal. Bowlfex occasionally.    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 No pertinent past medical history. Past Surgical History:  Procedure Laterality Date   VASECTOMY     No Known Allergies Prior to Admission medications   Medication Sig Start Date End Date Taking? Authorizing Provider  atorvastatin (LIPITOR) 10 MG tablet Take 1 tablet by mouth once daily 09/19/23  Yes Sheliah Hatch, MD  levothyroxine (SYNTHROID) 88 MCG tablet Take 1 tablet (88 mcg total) by mouth daily before breakfast. 12/28/22  Yes Shade Flood, MD  Multiple Vitamins-Minerals (CENTRUM SILVER ADULT 50+) TABS Take by mouth.   Yes [provider]  Triamcinolone Acetonide (TRIAMCINOLONE 0.1 % CREAM : EUCERIN) CREA Apply 1 application topically 3 (three) times daily. 07/10/14  Yes Collene Gobble, MD  clobetasol cream (TEMOVATE) 0.05 % Apply 1 application topically 2 (two) times daily. Patient not taking: Reported on 09/20/2023 04/07/14   Collene Gobble, MD   Social History   Socioeconomic History   Marital status: Divorced    Spouse name: Not on file   Number of children: Not on file   Years of education: Not on file   Highest education level: Not on file  Occupational History   Occupation: Accountant     Employer: [employer]  Tobacco Use   Smoking status: Never   Smokeless tobacco: Never  Vaping Use   Vaping status: Never Used  Substance and Sexual Activity   Alcohol use: No   Drug use: No   Sexual activity: Not Currently  Other Topics Concern   Not on file  Social History Narrative   Divorced. Education: [employer]. Exercise: [employer] 3 times a week for 30 minutes.   Social Drivers of Corporate investment banker Strain: Not on file  Food Insecurity: Not on file  Transportation Needs: Not on file  Physical Activity: Not on file  Stress:  Not on file  Social Connections: Not on file  Intimate Partner Violence: Not on file    Review of Systems   Objective:   Vitals:   09/20/23 0943  BP: 110/68  Pulse: 67  Temp: 98.3 F (36.8 C)  TempSrc: Temporal  SpO2: 95%  Weight: 186 lb (84.4 kg)  Height: 5' 8.75" (1.746 m)   {Vitals History (Optional):23777}  Physical Exam Vitals reviewed.  Constitutional:      Appearance: He is well-developed.  HENT:     Head: Normocephalic and atraumatic.     Right Ear: External ear normal.     Left Ear: External ear normal.  Eyes:     Conjunctiva/sclera: Conjunctivae normal.     Pupils: Pupils are equal, round, and reactive to light.  Neck:     Thyroid: No thyromegaly.  Cardiovascular:     Rate and Rhythm: Normal rate and regular rhythm.     Heart sounds: Normal heart sounds.  Pulmonary:     Effort: Pulmonary effort is normal. No respiratory distress.     Breath sounds: Normal breath sounds. No wheezing.  Abdominal:     General: There is no distension.     Palpations: Abdomen is soft.     Tenderness: There is no abdominal tenderness.  Musculoskeletal:        General: No tenderness. Normal range of motion.     Cervical back: Normal range of motion and neck supple.  Lymphadenopathy:     Cervical: No cervical adenopathy.  Skin:    General: Skin is warm and dry.     Comments: Left upper back - see photo. 4mm rounded  elevated flesh colored lesion. Coarse appearance.   Neurological:     Mental Status: He is alert and oriented to person, place, and time.     Deep Tendon Reflexes: Reflexes are normal and symmetric.  Psychiatric:        Behavior: Behavior normal.         Assessment & Plan:  Jeffery Clements is a 68 y.o. male . Annual physical exam - Plan: atorvastatin (LIPITOR) 10 MG tablet, levothyroxine (SYNTHROID) 88 MCG tablet, Hemoglobin A1c, CBC with Differential/Platelet, Comprehensive metabolic panel, Lipid panel, PSA, Medicare  - -anticipatory guidance as below in AVS, screening labs above. Health maintenance items as above in HPI discussed/recommended as applicable.   Hyperlipidemia, unspecified hyperlipidemia type - Plan: atorvastatin (LIPITOR) 10 MG tablet, Comprehensive metabolic panel, Lipid panel  -Tolerating Lipitor, continue same dose, check labs and adjust plan accordingly.  Hypothyroidism, unspecified type - Plan: levothyroxine (SYNTHROID) 88 MCG tablet, TSH  -Check TSH, adjust dosage accordingly, continue Synthroid same dose for now.  Prediabetes - Plan: Hemoglobin A1c  -Diet/exercise approach discussed.  Check A1c and adjust plan accordingly  Screening for prostate cancer - Plan: PSA, Medicare  Eczema - Plan: Triamcinolone Acetonide (TRIAMCINOLONE 0.1 % CREAM : EUCERIN) CREA  -Triamcinolone if needed, I did recommend he follow-up with his dermatologist regarding the increasing size lesion on left shoulder.  See photo.  Constipation, unspecified constipation type BRBPR (bright red blood per rectum) - Plan: CBC with Differential/Platelet  -.  Versus fissure with constipation and straining.  Stool regimen discussed including stool softener, MiraLAX and recheck in 1 week.  Also advised to follow-up with gastroenterology to decide on repeat testing interval for colonoscopy.  However if bright red blood per rectum returns, likely will refer to GI sooner.  Needs flu shot - Plan: Flu  Vaccine Trivalent High Dose (Fluad)   Meds  ordered this encounter  Medications   atorvastatin (LIPITOR) 10 MG tablet    Sig: Take 1 tablet (10 mg total) by mouth daily.    Dispense:  90 tablet    Refill:  0   levothyroxine (SYNTHROID) 88 MCG tablet    Sig: Take 1 tablet (88 mcg total) by mouth daily before breakfast.    Dispense:  90 tablet    Refill:  2   Triamcinolone Acetonide (TRIAMCINOLONE 0.1 % CREAM : EUCERIN) CREA    Sig: Apply 1 Application topically 3 (three) times daily.    Dispense:  1 each    Refill:  3   Patient Instructions  Call dermatology for appointment for the area on the shoulder that is changing.  Call gastroenterology ( Dr. Elnoria Howard) to determine repeat colonoscopy date. Let me know if any difficulty with this information.  Tdap vaccine at your pharmacy as well as second shingrix vaccine if those have not been given.  I will let you know if concerns on labs. Try miralax for constipation, fiber supplement like citrucel or metamucil for hard stools. If bleeding returns with normal bowel movements, you need to be seen. Recheck in 1 week unless everything has resolved.   Take care.  Return to the clinic or go to the nearest emergency room if any of your symptoms worsen or new symptoms occur.  Constipation, Adult Constipation is when a person has fewer than three bowel movements in a week, has difficulty having a bowel movement, or has stools (feces) that are dry, hard, or larger than normal. Constipation may be caused by an underlying condition. It may become worse with age if a person takes certain medicines and does not take in enough fluids. Follow these instructions at home: Eating and drinking  Eat foods that have a lot of fiber, such as beans, whole grains, and fresh fruits and vegetables. Limit foods that are low in fiber and high in fat and processed sugars, such as fried or sweet foods. These include french fries, hamburgers, cookies, candies, and  soda. Drink enough fluid to keep your urine pale yellow. General instructions Exercise regularly or as told by your health care provider. Try to do 150 minutes of moderate exercise each week. Use the bathroom when you have the urge to go. Do not hold it in. Take over-the-counter and prescription medicines only as told by your health care provider. This includes any fiber supplements. During bowel movements: Practice deep breathing while relaxing the lower abdomen. Practice pelvic floor relaxation. Watch your condition for any changes. Let your health care provider know about them. Keep all follow-up visits as told by your health care provider. This is important. Contact a health care provider if: You have pain that gets worse. You have a fever. You do not have a bowel movement after 4 days. You vomit. You are not hungry or you lose weight. You are bleeding from the opening between the buttocks (anus). You have thin, pencil-like stools. Get help right away if: You have a fever and your symptoms suddenly get worse. You leak stool or have blood in your stool. Your abdomen is bloated. You have severe pain in your abdomen. You feel dizzy or you faint. Summary Constipation is when a person has fewer than three bowel movements in a week, has difficulty having a bowel movement, or has stools (feces) that are dry, hard, or larger than normal. Eat foods that have a lot of fiber, such as beans, whole grains, and fresh  fruits and vegetables. Drink enough fluid to keep your urine pale yellow. Take over-the-counter and prescription medicines only as told by your health care provider. This includes any fiber supplements. This information is not intended to replace advice given to you by your health care provider. Make sure you discuss any questions you have with your health care provider. Document Revised: 06/22/2022 Document Reviewed: 06/22/2022 Elsevier Patient Education  2024 Elsevier  Inc.  Health Maintenance After Age 29 After age 66, you are at a higher risk for certain long-term diseases and infections as well as injuries from falls. Falls are a major cause of broken bones and head injuries in people who are older than age 23. Getting regular preventive care can help to keep you healthy and well. Preventive care includes getting regular testing and making lifestyle changes as recommended by your health care provider. Talk with your health care provider about: Which screenings and tests you should have. A screening is a test that checks for a disease when you have no symptoms. A diet and exercise plan that is right for you. What should I know about screenings and tests to prevent falls? Screening and testing are the best ways to find a health problem early. Early diagnosis and treatment give you the best chance of managing medical conditions that are common after age 59. Certain conditions and lifestyle choices may make you more likely to have a fall. Your health care provider may recommend: Regular vision checks. Poor vision and conditions such as cataracts can make you more likely to have a fall. If you wear glasses, make sure to get your prescription updated if your vision changes. Medicine review. Work with your health care provider to regularly review all of the medicines you are taking, including over-the-counter medicines. Ask your health care provider about any side effects that may make you more likely to have a fall. Tell your health care provider if any medicines that you take make you feel dizzy or sleepy. Strength and balance checks. Your health care provider may recommend certain tests to check your strength and balance while standing, walking, or changing positions. Foot health exam. Foot pain and numbness, as well as not wearing proper footwear, can make you more likely to have a fall. Screenings, including: Osteoporosis screening. Osteoporosis is a condition that  causes the bones to get weaker and break more easily. Blood pressure screening. Blood pressure changes and medicines to control blood pressure can make you feel dizzy. Depression screening. You may be more likely to have a fall if you have a fear of falling, feel depressed, or feel unable to do activities that you used to do. Alcohol use screening. Using too much alcohol can affect your balance and may make you more likely to have a fall. Follow these instructions at home: Lifestyle Do not drink alcohol if: Your health care provider tells you not to drink. If you drink alcohol: Limit how much you have to: 0-1 drink a day for women. 0-2 drinks a day for men. Know how much alcohol is in your drink. In the [place] one drink equals one 12 oz bottle of beer (355 mL), one 5 oz glass of wine (148 mL), or one 1 oz glass of hard liquor (44 mL). Do not use any products that contain nicotine or tobacco. These products include cigarettes, chewing tobacco, and vaping devices, such as e-cigarettes. If you need help quitting, ask your health care provider. Activity  Follow a regular exercise program to stay  fit. This will help you maintain your balance. Ask your health care provider what types of exercise are appropriate for you. If you need a cane or walker, use it as recommended by your health care provider. Wear supportive shoes that have nonskid soles. Safety  Remove any tripping hazards, such as rugs, cords, and clutter. Install safety equipment such as grab bars in bathrooms and safety rails on stairs. Keep rooms and walkways well-lit. General instructions Talk with your health care provider about your risks for falling. Tell your health care provider if: You fall. Be sure to tell your health care provider about all falls, even ones that seem minor. You feel dizzy, tiredness (fatigue), or off-balance. Take over-the-counter and prescription medicines only as told by your health care provider. These  include supplements. Eat a healthy diet and maintain a healthy weight. A healthy diet includes low-fat dairy products, low-fat (lean) meats, and fiber from whole grains, beans, and lots of fruits and vegetables. Stay current with your vaccines. Schedule regular health, dental, and eye exams. Summary Having a healthy lifestyle and getting preventive care can help to protect your health and wellness after age 3. Screening and testing are the best way to find a health problem early and help you avoid having a fall. Early diagnosis and treatment give you the best chance for managing medical conditions that are more common for people who are older than age 29. Falls are a major cause of broken bones and head injuries in people who are older than age 40. Take precautions to prevent a fall at home. Work with your health care provider to learn what changes you can make to improve your health and wellness and to prevent falls. This information is not intended to replace advice given to you by your health care provider. Make sure you discuss any questions you have with your health care provider. Document Revised: 12/28/2020 Document Reviewed: 12/28/2020 Elsevier Patient Education  2024 Elsevier Inc.    Signed,   Meredith Staggers, MD [facility], [facility] 09/20/23 10:30 AM

## 2023-09-20 NOTE — Patient Instructions (Addendum)
Call dermatology for appointment for the area on the shoulder that is changing.  Call gastroenterology ( Dr. Elnoria Howard) to determine repeat colonoscopy date. Let me know if any difficulty with this information.  Tdap vaccine at your pharmacy as well as second shingrix vaccine if those have not been given.  I will let you know if concerns on labs. Try miralax for constipation, fiber supplement like citrucel or metamucil for hard stools. If bleeding returns with normal bowel movements, you need to be seen. Recheck in 1 week unless everything has resolved.   Take care.  Return to the clinic or go to the nearest emergency room if any of your symptoms worsen or new symptoms occur.  Constipation, Adult Constipation is when a person has fewer than three bowel movements in a week, has difficulty having a bowel movement, or has stools (feces) that are dry, hard, or larger than normal. Constipation may be caused by an underlying condition. It may become worse with age if a person takes certain medicines and does not take in enough fluids. Follow these instructions at home: Eating and drinking  Eat foods that have a lot of fiber, such as beans, whole grains, and fresh fruits and vegetables. Limit foods that are low in fiber and high in fat and processed sugars, such as fried or sweet foods. These include french fries, hamburgers, cookies, candies, and soda. Drink enough fluid to keep your urine pale yellow. General instructions Exercise regularly or as told by your health care provider. Try to do 150 minutes of moderate exercise each week. Use the bathroom when you have the urge to go. Do not hold it in. Take over-the-counter and prescription medicines only as told by your health care provider. This includes any fiber supplements. During bowel movements: Practice deep breathing while relaxing the lower abdomen. Practice pelvic floor relaxation. Watch your condition for any changes. Let your health care  provider know about them. Keep all follow-up visits as told by your health care provider. This is important. Contact a health care provider if: You have pain that gets worse. You have a fever. You do not have a bowel movement after 4 days. You vomit. You are not hungry or you lose weight. You are bleeding from the opening between the buttocks (anus). You have thin, pencil-like stools. Get help right away if: You have a fever and your symptoms suddenly get worse. You leak stool or have blood in your stool. Your abdomen is bloated. You have severe pain in your abdomen. You feel dizzy or you faint. Summary Constipation is when a person has fewer than three bowel movements in a week, has difficulty having a bowel movement, or has stools (feces) that are dry, hard, or larger than normal. Eat foods that have a lot of fiber, such as beans, whole grains, and fresh fruits and vegetables. Drink enough fluid to keep your urine pale yellow. Take over-the-counter and prescription medicines only as told by your health care provider. This includes any fiber supplements. This information is not intended to replace advice given to you by your health care provider. Make sure you discuss any questions you have with your health care provider. Document Revised: 06/22/2022 Document Reviewed: 06/22/2022 Elsevier Patient Education  2024 Elsevier Inc.  Health Maintenance After Age 48 After age 58, you are at a higher risk for certain long-term diseases and infections as well as injuries from falls. Falls are a major cause of broken bones and head injuries in people who are  older than age 79. Getting regular preventive care can help to keep you healthy and well. Preventive care includes getting regular testing and making lifestyle changes as recommended by your health care provider. Talk with your health care provider about: Which screenings and tests you should have. A screening is a test that checks for a  disease when you have no symptoms. A diet and exercise plan that is right for you. What should I know about screenings and tests to prevent falls? Screening and testing are the best ways to find a health problem early. Early diagnosis and treatment give you the best chance of managing medical conditions that are common after age 38. Certain conditions and lifestyle choices may make you more likely to have a fall. Your health care provider may recommend: Regular vision checks. Poor vision and conditions such as cataracts can make you more likely to have a fall. If you wear glasses, make sure to get your prescription updated if your vision changes. Medicine review. Work with your health care provider to regularly review all of the medicines you are taking, including over-the-counter medicines. Ask your health care provider about any side effects that may make you more likely to have a fall. Tell your health care provider if any medicines that you take make you feel dizzy or sleepy. Strength and balance checks. Your health care provider may recommend certain tests to check your strength and balance while standing, walking, or changing positions. Foot health exam. Foot pain and numbness, as well as not wearing proper footwear, can make you more likely to have a fall. Screenings, including: Osteoporosis screening. Osteoporosis is a condition that causes the bones to get weaker and break more easily. Blood pressure screening. Blood pressure changes and medicines to control blood pressure can make you feel dizzy. Depression screening. You may be more likely to have a fall if you have a fear of falling, feel depressed, or feel unable to do activities that you used to do. Alcohol use screening. Using too much alcohol can affect your balance and may make you more likely to have a fall. Follow these instructions at home: Lifestyle Do not drink alcohol if: Your health care provider tells you not to drink. If  you drink alcohol: Limit how much you have to: 0-1 drink a day for women. 0-2 drinks a day for men. Know how much alcohol is in your drink. In the [place] one drink equals one 12 oz bottle of beer (355 mL), one 5 oz glass of wine (148 mL), or one 1 oz glass of hard liquor (44 mL). Do not use any products that contain nicotine or tobacco. These products include cigarettes, chewing tobacco, and vaping devices, such as e-cigarettes. If you need help quitting, ask your health care provider. Activity  Follow a regular exercise program to stay fit. This will help you maintain your balance. Ask your health care provider what types of exercise are appropriate for you. If you need a cane or walker, use it as recommended by your health care provider. Wear supportive shoes that have nonskid soles. Safety  Remove any tripping hazards, such as rugs, cords, and clutter. Install safety equipment such as grab bars in bathrooms and safety rails on stairs. Keep rooms and walkways well-lit. General instructions Talk with your health care provider about your risks for falling. Tell your health care provider if: You fall. Be sure to tell your health care provider about all falls, even ones that seem minor. You feel  dizzy, tiredness (fatigue), or off-balance. Take over-the-counter and prescription medicines only as told by your health care provider. These include supplements. Eat a healthy diet and maintain a healthy weight. A healthy diet includes low-fat dairy products, low-fat (lean) meats, and fiber from whole grains, beans, and lots of fruits and vegetables. Stay current with your vaccines. Schedule regular health, dental, and eye exams. Summary Having a healthy lifestyle and getting preventive care can help to protect your health and wellness after age 82. Screening and testing are the best way to find a health problem early and help you avoid having a fall. Early diagnosis and treatment give you the best  chance for managing medical conditions that are more common for people who are older than age 59. Falls are a major cause of broken bones and head injuries in people who are older than age 15. Take precautions to prevent a fall at home. Work with your health care provider to learn what changes you can make to improve your health and wellness and to prevent falls. This information is not intended to replace advice given to you by your health care provider. Make sure you discuss any questions you have with your health care provider. Document Revised: 12/28/2020 Document Reviewed: 12/28/2020 Elsevier Patient Education  2024 [employer].

## 2023-09-21 DIAGNOSIS — Z23 Encounter for immunization: Secondary | ICD-10-CM

## 2023-09-24 ENCOUNTER — Encounter: Payer: Self-pay | Admitting: Family Medicine

## 2023-09-28 ENCOUNTER — Ambulatory Visit: Payer: Managed Care, Other (non HMO) | Admitting: Family Medicine

## 2023-09-28 ENCOUNTER — Encounter: Payer: Self-pay | Admitting: Family Medicine

## 2023-09-28 VITALS — BP 124/62 | HR 74 | Temp 98.7°F | Ht 68.75 in | Wt 186.4 lb

## 2023-09-28 DIAGNOSIS — E039 Hypothyroidism, unspecified: Secondary | ICD-10-CM

## 2023-09-28 DIAGNOSIS — N50811 Right testicular pain: Secondary | ICD-10-CM | POA: Diagnosis not present

## 2023-09-28 DIAGNOSIS — N50812 Left testicular pain: Secondary | ICD-10-CM

## 2023-09-28 DIAGNOSIS — K602 Anal fissure, unspecified: Secondary | ICD-10-CM

## 2023-09-28 DIAGNOSIS — K625 Hemorrhage of anus and rectum: Secondary | ICD-10-CM | POA: Diagnosis not present

## 2023-09-28 MED ORDER — LIDOCAINE (ANORECTAL) 5 % EX GEL: CUTANEOUS | 0 refills | Status: DC

## 2023-09-28 MED ORDER — DILTIAZEM GEL 2 %: 1.0000 | Freq: Two times a day (BID) | CUTANEOUS | 0 refills | Status: DC

## 2023-09-28 MED ORDER — LEVOTHYROXINE SODIUM 100 MCG PO TABS: 100.0000 ug | ORAL_TABLET | Freq: Every day | ORAL | 3 refills | Status: DC

## 2023-09-28 MED ORDER — DOXYCYCLINE HYCLATE 100 MG PO TABS: 100.0000 mg | ORAL_TABLET | Freq: Two times a day (BID) | ORAL | 0 refills | Status: DC

## 2023-09-28 NOTE — Progress Notes (Signed)
 So  Subjective:  Patient ID: Jeffery Clements, male    DOB: 08-05-1956  Age: 68 y.o. MRN: [id number]  CC:  Chief Complaint  Patient presents with   Medical Management of Chronic Issues    Here for 1 wk f/u for blood in stool.     HPI Jeffery Clements presents for   Bright red blood per rectum: Discussed at his physical January 29.  Possible fissure or hemorrhoid with constipation and straining.  Stool softener, MiraLAX recommend initially with repeat evaluation in 1 week.  He did have a colonoscopy in 2018 with possible repeat in 10 years but he did have a polyp removed at that time.  Plan for him to contact GI to clarify timing of repeat testing. Hemoglobin normal last week.  Some hard stools at times. Citrucel fiber supplement daily, no miralax.  BRBPR few times past week. Improved day after last visit. Noted again 4-5 days ago - hard stool, straining. No BM for a few days, then BM yesterday - BRBPR - with wiping after BM. Not in toilet. Slight soreness at anal area. Softer stool. Has appt in October, repeat colonoscopy in December.   Testicular pain: New concern. Past few weeks. No penile d/c. Both testicles sore - left greater than right. No swelling. No hematuria. No dysuria/frequency/urgency.  Not sexually active. Masturbation only - sore after masturbation, next day then resolves. Other times as well. ?Constriction with briefs.   Hypothyroidism: Lab Results  Component Value Date   TSH 6.66 (H) 09/20/2023  Hair more brittle, more straight. Coarse. Taking 88mcg dose.      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 No pertinent past medical history. Past Surgical History:  Procedure Laterality Date   VASECTOMY     No Known Allergies Prior to Admission medications   Medication Sig Start Date End Date Taking? Authorizing Provider  atorvastatin  (LIPITOR) 10 MG tablet Take 1 tablet (10 mg total) by mouth daily. 09/20/23  Yes Levora Reyes SAUNDERS, MD  clobetasol  cream (TEMOVATE ) 0.05 % Apply 1 application topically 2 (two) times daily. 04/07/14  Yes Humberto Elspeth LABOR, MD  levothyroxine  (SYNTHROID ) 88 MCG tablet Take 1 tablet (88 mcg total) by mouth daily before breakfast. 09/20/23  Yes Levora Reyes SAUNDERS, MD  Multiple Vitamins-Minerals (CENTRUM SILVER ADULT 50+) TABS Take by mouth.   Yes [provider]  Triamcinolone  Acetonide (TRIAMCINOLONE  0.1 % CREAM : EUCERIN) CREA Apply 1 Application topically 3 (three) times daily. 09/20/23  Yes Levora Reyes SAUNDERS, MD   Social History   Socioeconomic History   Marital status: Divorced    Spouse name: Not on file   Number of children: Not on file   Years of education: Not on file   Highest education level: Not on file  Occupational History   Occupation: Accountant    Employer: [employer]  Tobacco Use   Smoking status: Never   Smokeless tobacco:  Never  Vaping Use   Vaping status: Never Used  Substance and Sexual Activity   Alcohol use: No   Drug use: No   Sexual activity: Not Currently  Other Topics Concern   Not on file  Social History Narrative   Divorced. Education: [employer]. Exercise: Bou[employer] 3 times a week for 30 minutes.   Social Drivers of Corporate Investment Banker Strain: Not on file  Food Insecurity: Not on file  Transportation Needs: Not on file  Physical Activity: Not on file  Stress: Not on file  Social Connections: Not on file  Intimate  Partner Violence: Not on file    Review of Systems Per HPI.  Objective:   Vitals:   09/28/23 1440  BP: 124/62  Pulse: 74  Temp: 98.7 F (37.1 C)  TempSrc: Temporal  SpO2: 94%  Weight: 186 lb 6 oz (84.5 kg)  Height: 5' 8.75 (1.746 m)     Physical Exam Vitals reviewed.  Constitutional:      Appearance: He is well-developed.  HENT:     Head: Normocephalic and atraumatic.  Neck:     Vascular: No carotid bruit or JVD.  Cardiovascular:     Rate and Rhythm: Normal rate and regular rhythm.     Heart sounds: Normal heart sounds. No murmur heard. Pulmonary:     Effort: Pulmonary effort is normal.     Breath sounds: Normal breath sounds. No rales.  Abdominal:     Hernia: There is no hernia in the left inguinal area or right inguinal area.  Genitourinary:    Penis: Normal.      Testes: Normal.        Right: Mass, tenderness or swelling not present.        Left: Mass, tenderness or swelling not present.     Epididymis:     Right: Normal.     Left: Normal.     Rectum: Anal fissure (Hemostatic anal fissure at 12:00.) present. No tenderness.  Musculoskeletal:     Right lower leg: No edema.     Left lower leg: No edema.  Lymphadenopathy:     Lower Body: No right inguinal adenopathy. No left inguinal adenopathy.  Skin:    General: Skin is warm and dry.  Neurological:     Mental Status: He is alert and oriented to person, place, and time.  Psychiatric:        Mood and Affect: Mood normal.        Assessment & Plan:  Jeffery Clements is a 68 y.o. male . Anal fissure - Plan: diltiazem  2 % GEL, Lidocaine , Anorectal, 5 % GEL BRBPR (bright red blood per rectum) - Plan: diltiazem  2 % GEL, Lidocaine , Anorectal, 5 % GEL  -Likely due to anal fissure noted on exam.  Constipation prevention, treatment discussed as primary goal along with treatment of fissure with diltiazem  gel, lidocaine  gel and handout given.  RTC precautions.  Pain in both testicles - Plan: doxycycline   (VIBRA -TABS) 100 MG tablet  -Asymptomatic on exam.  Continue to monitor.  Plans adjustment in underwear initially.  If recurrent symptoms can start doxycycline  for possible epididymoorchitis.  RTC precautions given, and discussed potential need for ultrasound.  Hypothyroidism, unspecified type - Plan: levothyroxine  (SYNTHROID ) 100 MCG tablet, TSH  -Due to previous levels will try a slightly higher dose of Synthroid  with repeat testing in 6 weeks.  Hypothyroidism may be contributing to constipation.   Meds ordered this encounter  Medications   diltiazem  2 % GEL  Sig: Apply 1 Application topically 2 (two) times daily. Pea sized amount to perianal skin before bowel movement, up to twice per day as needed.    Dispense:  60 g    Refill:  0   Lidocaine , Anorectal, 5 % GEL    Sig: Apply pea-sized amount to the perianal skin up to 3 times per day as needed.    Dispense:  30 g    Refill:  0   levothyroxine  (SYNTHROID ) 100 MCG tablet    Sig: Take 1 tablet (100 mcg total) by mouth daily.    Dispense:  90 tablet    Refill:  3   doxycycline  (VIBRA -TABS) 100 MG tablet    Sig: Take 1 tablet (100 mg total) by mouth 2 (two) times daily.    Dispense:  14 tablet    Refill:  0   Patient Instructions  Thank you for coming in today.  I do think the tear or anal fissure is the likely cause of the blood in the stool.  See information below.  Continue Citrucel for fiber supplement, MiraLAX can be helpful if you are having hard stools or constipation.  Apply the diltiazem  gel prior to bowel movements, and the lidocaine  gel as needed for discomfort.  If symptoms are not improving in the next week or 2, follow-up to discuss further.  Exam is reassuring today but if testicular pain is not improving with change in underwear, can start antibiotic for possible infection.  If that does not help, we do need to check an ultrasound.  Let me know.  I prescribed a higher dose of Synthroid  and we can recheck the levels in  6 weeks.  If any new side effects on that dose return to your previous dosage.  Anal Fissure, Adult  An anal fissure is a small tear or crack in the tissue near the opening of the butt (anus). In most cases, bleeding from a fissure stops on its own within a few minutes. You may have bleeding each time you poop until the fissure heals. What are the causes? An anal fissure may be caused by large or hard poop (stool). Other causes include: Having trouble pooping (constipation). Getting diarrhea a lot. Inflammatory bowel disease, such as Crohn's disease or ulcerative colitis. Childbirth. Infections. Anal sex. What are the signs or symptoms? Symptoms of an anal fissure include: Bleeding from the rectum. Small amounts of blood seen on your poop, on the toilet paper, or in the toilet after you poop. The blood coats the outside of the poop. It is not mixed with the poop. Pain when you poop. Itching or irritation around the anus. How is this diagnosed? A health care provider may diagnose a fissure by looking closely at the anal area. In some cases, a rectal exam may be done or a short tube (anoscope) may be used to look at the anal canal. How is this treated? Treatment for an anal fissure may include: Taking steps to avoid and treat constipation. Taking fiber supplements. These can help soften your poop. Taking sitz baths. These can help heal the tear. Using medicated creams or ointments. Doing physical therapy. This can help strengthen the area between your hip bones (pelvis). If other treatments do not work, you may need: Botulinum injections. Surgery to fix the fissure. Follow these instructions at home: Medicines Take or use over-the-counter and prescription medicines only as told by your provider. This includes medicated creams and ointments. Use supplements and medicines to make your poop  soft (stool softeners) as told by your provider. Managing constipation You may need to take  these actions to prevent or treat constipation: Drink enough fluid to keep your pee (urine) pale yellow. Eat foods that are high in fiber, such as beans, whole grains, and fresh fruits and vegetables. Avoid unripe bananas. Ripe bananas may help if you feel constipated. Limit foods that are high in fat and processed sugars, such as fried or sweet foods. Avoid dairy products, such as milk.  General instructions  Keep the anal area clean and dry. Take sitz baths as told by your provider. Do not use soap in the sitz baths. Contact a health care provider if: You have more bleeding. You have a fever. You have diarrhea that is mixed with blood. Your pain does not go away. Your problems get worse rather than better. This information is not intended to replace advice given to you by your health care provider. Make sure you discuss any questions you have with your health care provider. Document Revised: 08/25/2022 Document Reviewed: 08/25/2022 Elsevier Patient Education  2024 Elsevier Inc.     Signed,   Reyes Pines, MD [facility], [facility] 09/28/23 2:52 PM

## 2023-09-28 NOTE — Patient Instructions (Signed)
 Thank you for coming in today.  I do think the tear or anal fissure is the likely cause of the blood in the stool.  See information below.  Continue Citrucel for fiber supplement, MiraLAX can be helpful if you are having hard stools or constipation.  Apply the diltiazem  gel prior to bowel movements, and the lidocaine  gel as needed for discomfort.  If symptoms are not improving in the next week or 2, follow-up to discuss further.  Exam is reassuring today but if testicular pain is not improving with change in underwear, can start antibiotic for possible infection.  If that does not help, we do need to check an ultrasound.  Let me know.  I prescribed a higher dose of Synthroid  and we can recheck the levels in 6 weeks.  If any new side effects on that dose return to your previous dosage.  Anal Fissure, Adult  An anal fissure is a small tear or crack in the tissue near the opening of the butt (anus). In most cases, bleeding from a fissure stops on its own within a few minutes. You may have bleeding each time you poop until the fissure heals. What are the causes? An anal fissure may be caused by large or hard poop (stool). Other causes include: Having trouble pooping (constipation). Getting diarrhea a lot. Inflammatory bowel disease, such as Crohn's disease or ulcerative colitis. Childbirth. Infections. Anal sex. What are the signs or symptoms? Symptoms of an anal fissure include: Bleeding from the rectum. Small amounts of blood seen on your poop, on the toilet paper, or in the toilet after you poop. The blood coats the outside of the poop. It is not mixed with the poop. Pain when you poop. Itching or irritation around the anus. How is this diagnosed? A health care provider may diagnose a fissure by looking closely at the anal area. In some cases, a rectal exam may be done or a short tube (anoscope) may be used to look at the anal canal. How is this treated? Treatment for an anal fissure may  include: Taking steps to avoid and treat constipation. Taking fiber supplements. These can help soften your poop. Taking sitz baths. These can help heal the tear. Using medicated creams or ointments. Doing physical therapy. This can help strengthen the area between your hip bones (pelvis). If other treatments do not work, you may need: Botulinum injections. Surgery to fix the fissure. Follow these instructions at home: Medicines Take or use over-the-counter and prescription medicines only as told by your provider. This includes medicated creams and ointments. Use supplements and medicines to make your poop soft (stool softeners) as told by your provider. Managing constipation You may need to take these actions to prevent or treat constipation: Drink enough fluid to keep your pee (urine) pale yellow. Eat foods that are high in fiber, such as beans, whole grains, and fresh fruits and vegetables. Avoid unripe bananas. Ripe bananas may help if you feel constipated. Limit foods that are high in fat and processed sugars, such as fried or sweet foods. Avoid dairy products, such as milk.  General instructions  Keep the anal area clean and dry. Take sitz baths as told by your provider. Do not use soap in the sitz baths. Contact a health care provider if: You have more bleeding. You have a fever. You have diarrhea that is mixed with blood. Your pain does not go away. Your problems get worse rather than better. This information is not intended to replace  advice given to you by your health care provider. Make sure you discuss any questions you have with your health care provider. Document Revised: 08/25/2022 Document Reviewed: 08/25/2022 Elsevier Patient Education  2024 [employer].

## 2023-11-09 ENCOUNTER — Other Ambulatory Visit (INDEPENDENT_AMBULATORY_CARE_PROVIDER_SITE_OTHER): Payer: Managed Care, Other (non HMO)

## 2023-11-09 DIAGNOSIS — E039 Hypothyroidism, unspecified: Secondary | ICD-10-CM

## 2023-11-09 LAB — TSH: TSH: 0.95 u[IU]/mL (ref 0.35–5.50)

## 2023-11-14 ENCOUNTER — Encounter: Payer: Self-pay | Admitting: Family Medicine

## 2024-01-10 ENCOUNTER — Ambulatory Visit: Payer: Managed Care, Other (non HMO) | Admitting: Family Medicine

## 2024-01-10 ENCOUNTER — Encounter: Payer: Self-pay | Admitting: Family Medicine

## 2024-01-10 VITALS — BP 116/68 | HR 65 | Temp 98.0°F | Ht 68.0 in | Wt 171.8 lb

## 2024-01-10 DIAGNOSIS — E785 Hyperlipidemia, unspecified: Secondary | ICD-10-CM

## 2024-01-10 DIAGNOSIS — E039 Hypothyroidism, unspecified: Secondary | ICD-10-CM | POA: Diagnosis not present

## 2024-01-10 DIAGNOSIS — M25512 Pain in left shoulder: Secondary | ICD-10-CM | POA: Diagnosis not present

## 2024-01-10 DIAGNOSIS — R21 Rash and other nonspecific skin eruption: Secondary | ICD-10-CM

## 2024-01-10 DIAGNOSIS — R7303 Prediabetes: Secondary | ICD-10-CM

## 2024-01-10 MED ORDER — MELOXICAM 7.5 MG PO TABS: 7.5000 mg | ORAL_TABLET | Freq: Every day | ORAL | 0 refills | Status: DC | PRN

## 2024-01-10 NOTE — Progress Notes (Signed)
 Subjective:  Patient ID: Jeffery Clements, male    DOB: 1955-10-17  Age: 68 y.o. MRN: [id number]  CC:  Chief Complaint  Patient presents with   Follow-up    Patient states not feeling any side effects from dose increase, states he has lost a little bit of weight.     HPI Jeffery Clements presents for   Hypothyroidism: Lab Results  Component Value Date   TSH 0.95 11/09/2023  Decreased control in January.  Dosage was adjusted 100 mcg Synthroid  daily. Weight has decreased. Cut out sweets in diet. occasional red spots on skin - hands on upper chest. Past month. No itching. Comes and goes. Derm has seen similar area on hand, not on chest. Minimal soreness when inflamed.  We did discuss use of triamcinolone  in January on occasion for pruritic areas.  Left arm pain Notes with pulling arm behind back. Past month. No fall, injury. No prior pain.  R hand dominant.  Not limiting activities, but some difficulty tucking in shirt. Able to use Bowflex. Slight discomfort in shoulder.  No relief with tylenol. No neck pain.  No hx of CKD or PUD.   Hyperlipidemia: Lipitor 10 mg daily. Last ate 4 hrs ago.  Plans on lab visit in next month.   Lab Results  Component Value Date   CHOL 135 09/20/2023   HDL 57.60 09/20/2023   LDLCALC 54 09/20/2023   TRIG 121.0 09/20/2023   CHOLHDL 2 09/20/2023   Lab Results  Component Value Date   ALT 20 09/20/2023   AST 19 09/20/2023   ALKPHOS 106 09/20/2023   BILITOT 1.5 (H) 09/20/2023      Prediabetes: History of prediabetes with A1c at diabetic level in January.  Has improved diet as above cutting back on sweets, weight has improved. 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 Past Surgical History:  Procedure Laterality Date   VASECTOMY     No Known Allergies Prior to Admission medications   Medication Sig Start Date End Date Taking? Authorizing Provider  atorvastatin  (LIPITOR) 10 MG tablet Take 1 tablet (10 mg total) by mouth daily. 09/20/23  Yes Jeffery Bras, MD  levothyroxine  (SYNTHROID ) 100 MCG tablet Take 1 tablet (100 mcg total) by mouth daily. 09/28/23  Yes Jeffery Bras, MD  Multiple Vitamins-Minerals (CENTRUM SILVER ADULT 50+) TABS Take by mouth.   Yes [provider]  clobetasol  cream (TEMOVATE ) 0.05 % Apply 1 application topically 2 (two) times daily. Patient not taking: Reported on 01/10/2024 04/07/14   Kandy Orris, MD  diltiazem  2 % GEL Apply 1 Application topically 2 (two) times daily. Pea sized amount to perianal skin before bowel movement, up to twice per day as needed. Patient not taking: Reported on 01/10/2024 09/28/23   Jeffery Bras, MD  doxycycline  (VIBRA -TABS) 100 MG tablet Take  1 tablet (100 mg total) by mouth 2 (two) times daily. Patient not taking: Reported on 01/10/2024 09/28/23   Jeffery Bras, MD  Lidocaine , Anorectal, 5 % GEL Apply pea-sized amount to the perianal skin up to 3 times per day as needed. Patient not taking: Reported on 01/10/2024 09/28/23   Jeffery Bras, MD  Triamcinolone  Acetonide (TRIAMCINOLONE  0.1 % CREAM : EUCERIN) CREA Apply 1 Application topically 3 (three) times daily. Patient not taking: Reported on 01/10/2024 09/20/23   Jeffery Bras, MD   Social History   Socioeconomic History   Marital status: Divorced    Spouse name: Not on file   Number of  children: Not on file   Years of education: Not on file   Highest education level: Not on file  Occupational History   Occupation: Accountant    Employer: [employer]  Tobacco Use   Smoking status: Never   Smokeless tobacco: Never  Vaping Use   Vaping status: Never Used  Substance and Sexual Activity   Alcohol use: No   Drug use: No   Sexual activity: Not Currently  Other Topics Concern   Not on file  Social History Narrative   Divorced. Education: [employer]. Exercise: Bouflex 3 times a week for 30 minutes.   Social Drivers of Corporate investment banker Strain: Not on file  Food Insecurity: Not on file  Transportation Needs: Not on file  Physical Activity: Not on file  Stress: Not on file  Social Connections: Not on file  Intimate Partner Violence: Not on file    Review of Systems Per hpi  Objective:   Vitals:   01/10/24 1544  BP: 116/68  Pulse: 65  Temp: 98 F (36.7 C)  TempSrc: Oral  SpO2: 97%  Weight: 171 lb 12.8 oz (77.9 kg)  Height: 5\' 8"  (1.727 m)     Physical Exam Vitals reviewed.  Constitutional:      Appearance: He is well-developed.  HENT:     Head: Normocephalic and atraumatic.  Neck:     Vascular: No carotid bruit or JVD.  Cardiovascular:     Rate and Rhythm: Normal rate and regular rhythm.     Heart sounds: Normal heart sounds. No murmur heard. Pulmonary:     Effort: Pulmonary effort is normal.     Breath sounds: Normal breath sounds. No rales.  Musculoskeletal:     Right lower leg: No edema.     Left lower leg: No edema.     Comments: C-spine nontender Left shoulder, no bony tenderness including AC, [place], clavicle. Range of motion minimally limited with internal rotation on left versus right by approximately 2-3 vertebral levels.  Otherwise full range of motion.  Discomfort with empty can but no weakness.  Minimal discomfort with Neer.  Neurovascular intact distally.  Skin:    General: Skin is warm and dry.  Neurological:      Mental Status: He is alert and oriented to person, place, and time.  Psychiatric:        Mood and Affect: Mood normal.          Assessment & Plan:  Jeffery Clements is a 68 y.o. male . Acute pain of left shoulder - Plan: meloxicam  (MOBIC ) 7.5 MG tablet  - Rotator cuff tendinosis versus early adhesive capsulitis, less likely.  Trial of meloxicam , and will advise me if not improving, consideration of imaging or Ortho eval if persistent.  Activity modification discussed in the interim.  Hypothyroidism, unspecified type -  Plan: TSH Check TSH, no med changes for now.   Hyperlipidemia, unspecified hyperlipidemia type - Plan: Comprehensive metabolic panel with GFR, Lipid panel Tolerating Lipitor, check labs and adjust plan accordingly.  Fasting labs in the next 1 month.  Prediabetes - Plan: Hemoglobin A1c On dietary changes as above.  Check A1c next lab visit.  Rash and nonspecific skin eruption Intermittent flares of rash as above on hands, chest.  Does not appear typical contact dermatitis, question urticaria but no acute itching.  Obvious if symptoms recur and to take photos when they do flare.  RTC precautions.  Meds ordered this encounter  Medications   meloxicam  (MOBIC ) 7.5 MG tablet    Sig: Take 1 tablet (7.5 mg total) by mouth daily as needed.    Dispense:  30 tablet    Refill:  0   Patient Instructions  Could have a component of overuse or rotator cuff tendon inflammation in the left shoulder.  Try the meloxicam  once per day for the next week or 2 and try to avoid repetitive shoulder exercises or heavy weights.  If not improving, let me know and I can order x-ray along with follow-up visit or orthopedic eval.  If you continue to have skin issues on the chest wall or hand, recommend follow-up with dermatologist, especially if no change with use of steroid cream when it flares.  I do recommend getting a photo of it when it flares to show your dermatologist.  Lab only visit in  the next 1 month, no med changes for now.  Congratulations on the weight loss, keep up the good work.  Take care!    Signed,   Caro Christmas, MD [facility], [facility] 01/10/24 4:21 PM

## 2024-01-10 NOTE — Patient Instructions (Signed)
 Could have a component of overuse or rotator cuff tendon inflammation in the left shoulder.  Try the meloxicam  once per day for the next week or 2 and try to avoid repetitive shoulder exercises or heavy weights.  If not improving, let me know and I can order x-ray along with follow-up visit or orthopedic eval.  If you continue to have skin issues on the chest wall or hand, recommend follow-up with dermatologist, especially if no change with use of steroid cream when it flares.  I do recommend getting a photo of it when it flares to show your dermatologist.  Lab only visit in the next 1 month, no med changes for now.  Congratulations on the weight loss, keep up the good work.  Take care!

## 2024-01-13 ENCOUNTER — Encounter: Payer: Self-pay | Admitting: Family Medicine

## 2024-02-12 ENCOUNTER — Other Ambulatory Visit (INDEPENDENT_AMBULATORY_CARE_PROVIDER_SITE_OTHER)

## 2024-02-12 DIAGNOSIS — E785 Hyperlipidemia, unspecified: Secondary | ICD-10-CM | POA: Diagnosis not present

## 2024-02-12 DIAGNOSIS — E039 Hypothyroidism, unspecified: Secondary | ICD-10-CM | POA: Diagnosis not present

## 2024-02-12 DIAGNOSIS — R7303 Prediabetes: Secondary | ICD-10-CM

## 2024-02-12 LAB — COMPREHENSIVE METABOLIC PANEL WITH GFR
ALT: 29 U/L (ref 0–53)
AST: 23 U/L (ref 0–37)
Albumin: 4.4 g/dL (ref 3.5–5.2)
Alkaline Phosphatase: 99 U/L (ref 39–117)
BUN: 15 mg/dL (ref 6–23)
CO2: 25 meq/L (ref 19–32)
Calcium: 9.7 mg/dL (ref 8.4–10.5)
Chloride: 103 meq/L (ref 96–112)
Creatinine, Ser: 0.88 mg/dL (ref 0.40–1.50)
GFR: 87.74 mL/min (ref >60.00)
Glucose, Bld: 106 mg/dL — ABNORMAL HIGH (ref 70–99)
Potassium: 3.9 meq/L (ref 3.5–5.1)
Sodium: 138 meq/L (ref 135–145)
Total Bilirubin: 1.3 mg/dL — ABNORMAL HIGH (ref 0.2–1.2)
Total Protein: 7.6 g/dL (ref 6.0–8.3)

## 2024-02-12 LAB — LIPID PANEL
Cholesterol: 119 mg/dL (ref 0–200)
HDL: 53.5 mg/dL (ref >39.00)
LDL Cholesterol: 48 mg/dL (ref 0–99)
NonHDL: 65.66
Total CHOL/HDL Ratio: 2
Triglycerides: 90 mg/dL (ref 0.0–149.0)
VLDL: 18 mg/dL (ref 0.0–40.0)

## 2024-02-12 LAB — TSH: TSH: 0.16 u[IU]/mL — ABNORMAL LOW (ref 0.35–5.50)

## 2024-02-12 LAB — HEMOGLOBIN A1C: Hgb A1c MFr Bld: 6.3 % (ref 4.6–6.5)

## 2024-02-13 ENCOUNTER — Other Ambulatory Visit: Payer: Self-pay | Admitting: Family Medicine

## 2024-02-13 ENCOUNTER — Ambulatory Visit: Payer: Self-pay | Admitting: Family Medicine

## 2024-02-13 DIAGNOSIS — E039 Hypothyroidism, unspecified: Secondary | ICD-10-CM

## 2024-02-13 MED ORDER — LEVOTHYROXINE SODIUM 88 MCG PO TABS: 88.0000 ug | ORAL_TABLET | Freq: Every day | ORAL | 1 refills | Status: AC

## 2024-02-13 NOTE — Progress Notes (Signed)
 See labs

## 2024-02-13 NOTE — Telephone Encounter (Signed)
 Lab only visit scheduled and future orders placed

## 2024-03-18 ENCOUNTER — Other Ambulatory Visit (INDEPENDENT_AMBULATORY_CARE_PROVIDER_SITE_OTHER)

## 2024-03-18 DIAGNOSIS — E039 Hypothyroidism, unspecified: Secondary | ICD-10-CM | POA: Diagnosis not present

## 2024-03-19 ENCOUNTER — Other Ambulatory Visit: Payer: Self-pay | Admitting: Family Medicine

## 2024-03-19 DIAGNOSIS — Z0000 Encounter for general adult medical examination without abnormal findings: Secondary | ICD-10-CM

## 2024-03-19 DIAGNOSIS — E785 Hyperlipidemia, unspecified: Secondary | ICD-10-CM

## 2024-03-19 LAB — TSH: TSH: 1.04 u[IU]/mL (ref 0.35–5.50)

## 2024-03-21 ENCOUNTER — Ambulatory Visit: Payer: Self-pay | Admitting: Family Medicine

## 2024-05-16 ENCOUNTER — Ambulatory Visit: Admitting: Family Medicine

## 2024-05-16 VITALS — BP 100/70 | HR 70 | Temp 98.3°F | Resp 14 | Ht 68.0 in | Wt 169.2 lb

## 2024-05-16 DIAGNOSIS — E039 Hypothyroidism, unspecified: Secondary | ICD-10-CM

## 2024-05-16 DIAGNOSIS — R7303 Prediabetes: Secondary | ICD-10-CM | POA: Diagnosis not present

## 2024-05-16 DIAGNOSIS — M25512 Pain in left shoulder: Secondary | ICD-10-CM | POA: Diagnosis not present

## 2024-05-16 DIAGNOSIS — Z23 Encounter for immunization: Secondary | ICD-10-CM

## 2024-05-16 DIAGNOSIS — E785 Hyperlipidemia, unspecified: Secondary | ICD-10-CM | POA: Diagnosis not present

## 2024-05-16 LAB — LIPID PANEL
Cholesterol: 121 mg/dL (ref 0–200)
HDL: 57.8 mg/dL (ref >39.00)
LDL Cholesterol: 46 mg/dL (ref 0–99)
NonHDL: 63.21
Total CHOL/HDL Ratio: 2
Triglycerides: 86 mg/dL (ref 0.0–149.0)
VLDL: 17.2 mg/dL (ref 0.0–40.0)

## 2024-05-16 LAB — COMPREHENSIVE METABOLIC PANEL WITH GFR
ALT: 19 U/L (ref 0–53)
AST: 20 U/L (ref 0–37)
Albumin: 4.7 g/dL (ref 3.5–5.2)
Alkaline Phosphatase: 86 U/L (ref 39–117)
BUN: 12 mg/dL (ref 6–23)
CO2: 31 meq/L (ref 19–32)
Calcium: 10 mg/dL (ref 8.4–10.5)
Chloride: 101 meq/L (ref 96–112)
Creatinine, Ser: 0.85 mg/dL (ref 0.40–1.50)
GFR: 88.51 mL/min (ref >60.00)
Glucose, Bld: 103 mg/dL — ABNORMAL HIGH (ref 70–99)
Potassium: 4.7 meq/L (ref 3.5–5.1)
Sodium: 139 meq/L (ref 135–145)
Total Bilirubin: 1.4 mg/dL — ABNORMAL HIGH (ref 0.2–1.2)
Total Protein: 7.6 g/dL (ref 6.0–8.3)

## 2024-05-16 LAB — HEMOGLOBIN A1C: Hgb A1c MFr Bld: 6.7 % — ABNORMAL HIGH (ref 4.6–6.5)

## 2024-05-16 LAB — TSH: TSH: 0.37 u[IU]/mL (ref 0.35–5.50)

## 2024-05-16 MED ORDER — ATORVASTATIN CALCIUM 10 MG PO TABS: 10.0000 mg | ORAL_TABLET | Freq: Every day | ORAL | 1 refills | Status: AC

## 2024-05-16 NOTE — Progress Notes (Signed)
 Subjective:  Patient ID: Jeffery Clements, male    DOB: [birth year]  Age: 68 y.o. MRN: [id number]  CC:  Chief Complaint  Patient presents with   Hyperlipidemia   Hypothyroidism   Diabetes   Shoulder Pain    Left shoulder is still causing some pain. Sx started pack in March/April.     HPI Jeffery Clements presents for  Multiple concerns above.   Hyperlipidemia: Lipitor 10 mg daily. No new myalgias/side effects.  Lab Results  Component Value Date   CHOL 119 02/12/2024   HDL 53.50 02/12/2024   LDLCALC 48 02/12/2024   TRIG 90.0 02/12/2024   CHOLHDL 2 02/12/2024   Lab Results  Component Value Date   ALT 29 02/12/2024   AST 23 02/12/2024   ALKPHOS 99 02/12/2024   BILITOT 1.3 (H) 02/12/2024   Hypothyroidism: Lab Results  Component Value Date   TSH 1.04 03/18/2024  Synthroid  88 mcg daily - prior 100mcg, lowered dose to 88mcg with tsh 0.16 on 02/12/24 Taking medication daily.  No new hot or cold intolerance. No new hair or skin changes, heart palpitations or new fatigue. No new weight changes.   Left shoulder pain Discussed at his May visit, symptoms for 1 month at that time without apparent injury.  Slight discomfort at that time but not affecting activities.  Possible component of overuse or rotator cuff tendinosis.  Started on meloxicam  daily for 2 weeks, avoid repetitive shoulder exercise or heavy weights then to let me know if not improving for possible imaging and orthopedic eval. Took meloxicam  - no change. Still sore. About the same.  Sore with reaching behind or leaning on shoulder.  No ortho.   Prediabetes/Diabetes: History of prediabetes with borderline diabetic level with A1c 6.5 in January.  Continued diet/exercise approach.  Levels had improved at repeat testing in June.  Weight has significantly improved from February, 186 pounds down to 169 pounds today. Still trying to watch diet. Minimal exercise with shoulder issues. Mowing yard.   Retiring end of year.    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 No pertinent past medical history. Past Surgical History:  Procedure Laterality Date   VASECTOMY     No Known Allergies Prior to Admission medications   Medication Sig Start Date End Date Taking? Authorizing Provider  atorvastatin  (LIPITOR) 10 MG tablet Take 1 tablet by mouth once daily 03/19/24  Yes Levora Reyes SAUNDERS, MD  levothyroxine  (SYNTHROID ) 88 MCG tablet Take 1 tablet (88 mcg total) by mouth daily. 02/13/24  Yes Levora Reyes SAUNDERS, MD  Multiple Vitamins-Minerals (CENTRUM SILVER ADULT 50+) TABS Take by mouth.   Yes [provider]  meloxicam  (MOBIC ) 7.5 MG tablet Take 1  tablet (7.5 mg total) by mouth daily as needed. 01/10/24   Levora Reyes SAUNDERS, MD   Social History   Socioeconomic History   Marital status: Divorced    Spouse name: Not on file   Number of children: Not on file   Years of education: Not on file   Highest education level: Associate degree: academic program  Occupational History   Occupation: Agricultural engineer: [employer]  Tobacco Use   Smoking status: Never   Smokeless tobacco: Never  Vaping Use   Vaping status: Never Used  Substance and Sexual Activity    Alcohol use: No   Drug use: No   Sexual activity: Not Currently  Other Topics Concern   Not on file  Social History Narrative   Divorced. Education: [employer]. Exercise: Bouflex 3 times a week for 30 minutes.   Social Drivers of Corporate investment banker Strain: Low Risk  (05/16/2024)   Overall Financial Resource Strain (CARDIA)    Difficulty of Paying Living Expenses: Not hard at all  Food Insecurity: No Food Insecurity (05/16/2024)   Hunger Vital Sign    Worried About Running Out of Food in the Last Year: Never true    Ran Out of Food in the Last Year: Never true  Transportation Needs: No Transportation Needs (05/16/2024)   PRAPARE - Administrator, Civil Service (Medical): No    Lack of Transportation (Non-Medical): No  Physical Activity: Insufficiently Active (05/16/2024)   Exercise Vital Sign    Days of Exercise per Week: 2 days    Minutes of Exercise per Session: 20 min  Stress: No Stress Concern Present (05/16/2024)   [employer] of Occupational Health - Occupational Stress Questionnaire    Feeling of Stress: Not at all  Social Connections: Socially Isolated (05/16/2024)   Social Connection and Isolation Panel    Frequency of Communication with Friends and Family: Once a week    Frequency of Social Gatherings with Friends and Family: Once a week    Attends Religious Services: 1 to 4 times per year    Active Member of [employer] or Organizations: No    Attends Engineer, structural: Not on file    Marital Status: Divorced  Intimate Partner Violence: Not on file    Review of Systems  Constitutional:  Negative for fatigue and unexpected weight change.  Eyes:  Negative for visual disturbance.  Respiratory:  Negative for cough, chest tightness and shortness of breath.   Cardiovascular:  Negative for chest pain, palpitations and leg swelling.  Gastrointestinal:  Negative for abdominal pain and blood in stool.  Neurological:  Negative for dizziness,  light-headedness and headaches.     Objective:   Vitals:   05/16/24 0939  BP: 100/70  Pulse: 70  Resp: 14  Temp: 98.3 F (36.8 C)  TempSrc: Temporal  SpO2: 95%  Weight: 169 lb 3.2 oz (76.7 kg)  Height: 5' 8 (1.727 m)     Physical Exam Vitals reviewed.  Constitutional:      Appearance: He is well-developed.  HENT:     Head: Normocephalic and atraumatic.  Neck:     Vascular: No carotid bruit or JVD.  Cardiovascular:     Rate and Rhythm: Normal rate and regular rhythm.     Heart sounds: Normal heart sounds. No murmur heard. Pulmonary:     Effort: Pulmonary effort is normal.     Breath sounds: Normal breath sounds. No rales.  Musculoskeletal:  Right lower leg: No edema.     Left lower leg: No edema.     Comments: C-spine, pain-free range of motion, no midline bony tenderness, does not reproduce shoulder symptoms Left shoulder, no focal bony tenderness.  Discomfort with internal rotation, lacks approximately 2 vertebral levels compared to the right side on internal rotation testing.  Negative liftoff.  Negative empty can.  Full strength with resisted internal and external rotation.  Minimal discomfort with Neer, negative Hawkins.  Skin:    General: Skin is warm and dry.  Neurological:     Mental Status: He is alert and oriented to person, place, and time.  Psychiatric:        Mood and Affect: Mood normal.        Assessment & Plan:  Jeffery Clements is a 68 y.o. male . Left shoulder pain, unspecified chronicity - Plan: Ambulatory referral to Orthopedic Surgery  - Persistent left shoulder pain, question component of rotator cuff tendinosis, discomfort with internal rotation versus adhesive capsulitis with minimal change in range of motion.  No significant change with meloxicam  previously.  Will have evaluated by Ortho to decide on ultrasound, imaging, injection or PT.  No new meds for now.  Activity modification.  Need for influenza vaccination - Plan: Flu vaccine  HIGH DOSE PF(Fluzone Trivalent)  Hyperlipidemia, unspecified hyperlipidemia type - Plan: atorvastatin  (LIPITOR) 10 MG tablet, Comprehensive metabolic panel with GFR, Lipid panel  -  Stable, tolerating current regimen. Medications refilled. Labs pending as above.   Prediabetes - Plan: Hemoglobin A1c  - Weight has improved, check A1c and adjust plan accordingly, no new meds for now.  Hypothyroidism, unspecified type - Plan: TSH  - Improved TSH on lower dose of Synthroid , will repeat TSH to check for stability at this time, then if stable can recheck in 6 months.  Continue 88 mcg dose for now.  Meds ordered this encounter  Medications   atorvastatin  (LIPITOR) 10 MG tablet    Sig: Take 1 tablet (10 mg total) by mouth daily.    Dispense:  90 tablet    Refill:  1   Patient Instructions  Thank you for coming in today. No change in medications at this time. If there are any concerns on your bloodwork, I will let you know. I will refer you to ortho for your shoulder. Take care!     Signed,   Reyes Pines, MD [facility], [facility] 05/16/24 10:22 AM

## 2024-05-16 NOTE — Patient Instructions (Signed)
 Thank you for coming in today. No change in medications at this time. If there are any concerns on your bloodwork, I will let you know. I will refer you to ortho for your shoulder. Take care!

## 2024-05-19 ENCOUNTER — Ambulatory Visit: Payer: Self-pay | Admitting: Family Medicine

## 2024-06-03 ENCOUNTER — Other Ambulatory Visit: Payer: Self-pay | Admitting: Radiology

## 2024-06-03 ENCOUNTER — Ambulatory Visit: Admitting: Physician Assistant

## 2024-06-03 ENCOUNTER — Encounter: Payer: Self-pay | Admitting: Physician Assistant

## 2024-06-03 ENCOUNTER — Other Ambulatory Visit: Payer: Self-pay

## 2024-06-03 DIAGNOSIS — R200 Anesthesia of skin: Secondary | ICD-10-CM

## 2024-06-03 DIAGNOSIS — M25512 Pain in left shoulder: Secondary | ICD-10-CM

## 2024-06-03 DIAGNOSIS — G8929 Other chronic pain: Secondary | ICD-10-CM

## 2024-06-03 NOTE — Progress Notes (Signed)
 HPI: Jeffery Clements 68 year old male comes in today with left shoulder pain that is been ongoing since April 2025.  No injury or fall.  Pain in shoulder joint radiates to mid humerus.  Denies any bruising.  Pain is worse with range of motion particularly extreme internal and external rotation.  Has difficulty donning close due to the pain in the shoulder.  No weakness numbness down the arm.  He has learned to adapt his ADLs right dominant shoulder just that he does not aggravate the shoulder. He does note some left hand numbness that began about a month ago it involves all of the fingers except for the thumb.  He states he is prediabetic with a hemoglobin A1c of 5.6.  Review of systems: Denies any fevers chills.  Physical exam: General Well-developed well-nourished male no acute distress ambulates without any assistive device. Respirations: Unlabored Bilateral shoulders: 5 out of 5 strength with external/internal rotation against resistance empty can test is negative.  Exam able to perform liftoff test posterior on the left side secondary to pain.  Liftoff test on the right is negative.  Positive impingement left shoulder only.  Forward flexion full on the right left lacks the last 15 to 20 degrees. Bilateral hands: Radial pulses 2+ and equal symmetric.  Sensation grossly  right hand and subjective decrease sensation 2nd through 4th fingers to light touch.  Full range of motion bilateral hands.  No rashes skin lesions or ulcerations. Cervical spine: Good range of motion of the cervical spine without pain.   Radiographs: Left shoulder 3 views: No acute fractures.  Shoulder is well located.  Mild AC joint arthritic changes.  Otherwise no considerable arthrosis.  Glenohumeral joint is well-maintained.    Impression: Left shoulder impingement Left hand paresthesia  Plan: Discussed with him physical therapy versus formal or given him handouts for shoulder exercises on his own.  He would like to try  exercises for shoulder on his own and therefore handouts were given and discussed.  In regards to the left shoulder recommended cortisone injection subacromial he was agreeable.  Will obtain EMG nerve conduction studies of the left upper extremity rule out carpal tunnel syndrome.  He will follow-up with us  in 2 weeks to see what type or results he has with the steroid injections and shoulder injection on the left.  Questions were encouraged

## 2024-06-05 ENCOUNTER — Telehealth: Payer: Self-pay | Admitting: Physical Medicine and Rehabilitation

## 2024-06-05 NOTE — Telephone Encounter (Signed)
 Pt called wanting to make an appt for nerve conduction. Call back number is [phone]

## 2024-06-17 ENCOUNTER — Ambulatory Visit: Admitting: Physician Assistant

## 2024-06-17 ENCOUNTER — Encounter: Payer: Self-pay | Admitting: Physician Assistant

## 2024-06-17 DIAGNOSIS — R200 Anesthesia of skin: Secondary | ICD-10-CM

## 2024-06-17 DIAGNOSIS — M7542 Impingement syndrome of left shoulder: Secondary | ICD-10-CM

## 2024-06-17 NOTE — Progress Notes (Signed)
 HPI: Jeffery Clements returns today for follow-up of his left shoulder.  He states that the injection really gave him no relief.  He feels that he is improved with stretching exercises for his shoulders given.  Feels like his some 10 to 15% improved in regards to his shoulder.  Still having 8 out of 10 pain in the shoulder mostly in the biceps region.  Denies any radicular symptoms down either arm.  Continues to have numbness in the fingers except for the thumb on the left hand.  Has scheduled EMG nerve conduction studies 06/19/2024.  Review of systems see HPI otherwise negative  Physical exam: General Well-developed well-nourished male no acute distress mood and affect appropriate. Psych: Alert and orient x 3 Bilateral hands.  No subjective decrease sensation in either hand to light touch.  Radial pulses are 2+ and equal symmetric.  No rashes skin lesions of either hand.  Radial pulses are 2+ and equal symmetric bilaterally.  Compression test over the median nerve at the wrist on the left causes numbness tingling.  Tinel's over the median nerve at the wrist on the left is positive.  Phalen's test is also positive only on the left. Bilateral shoulders: 5 out of 5 strength with external and internal rotation against resistance.  He lacks the last few degrees with forward flexion on the left passively full overhead motion is achievable what causes discomfort.  Full active overhead range of motion on the right without pain.  Liftoff test is negative bilaterally.  Empty can test negative bilaterally.  Impingement testing is positive on the left.  Cervical spine: Full range of motion cervical spine without pain.  Negative Spurling's.  Nontender medial border scapula bilaterally.   Impression: Paresthesia left hand Left shoulder impingement  Plan: Discussed with him sending formal therapy for his shoulder he defers.  Therefore we will continue home exercises.  In regards to the left hand we will have him  undergo EMG nerve conduction studies to rule out carpal tunnel syndrome and then follow-up with Dr. Vernetta after the study to go over results and discuss further treatment.  Questions were encouraged and answered at length.

## 2024-06-19 ENCOUNTER — Ambulatory Visit: Admitting: Physical Medicine and Rehabilitation

## 2024-06-19 DIAGNOSIS — R29898 Other symptoms and signs involving the musculoskeletal system: Secondary | ICD-10-CM

## 2024-06-19 DIAGNOSIS — R202 Paresthesia of skin: Secondary | ICD-10-CM | POA: Diagnosis not present

## 2024-06-19 DIAGNOSIS — M79642 Pain in left hand: Secondary | ICD-10-CM

## 2024-06-19 NOTE — Procedures (Unsigned)
 EMG & NCV Findings: All nerve conduction studies (as indicated in the following tables) were within normal limits.    All examined muscles (as indicated in the following table) showed no evidence of electrical instability.    Impression: Essentially NORMAL electrodiagnostic study of the left upper limb.  There is no significant electrodiagnostic evidence of nerve entrapment, brachial plexopathy or cervical radiculopathy.    As you know, purely sensory or demyelinating radiculopathies and chemical radiculitis may not be detected with this particular electrodiagnostic study.  Recommendations: 1.  Follow-up with referring physician. 2.  Continue current management of symptoms.  ___________________________ Prentice Masters FAAPMR Board Certified, American Board of Physical Medicine and Rehabilitation    Nerve Conduction Studies Anti Sensory Summary Table   Stim Site NR Peak (ms) Norm Peak (ms) P-T Amp (V) Norm P-T Amp Site1 Site2 Delta-P (ms) Dist (cm) Vel (m/s) Norm Vel (m/s)  Left Median Acr Palm Anti Sensory (2nd Digit)  31.2C  Wrist    3.1 <3.6 36.7 >10 Wrist Palm 1.4 0.0    Palm    1.7 <2.0 37.1         Left Radial Anti Sensory (Base 1st Digit)  30.8C  Wrist    2.2 <3.1 29.8  Wrist Base 1st Digit 2.2 0.0    Left Ulnar Anti Sensory (5th Digit)  31.4C  Wrist    3.4 <3.7 23.1 >15.0 Wrist 5th Digit 3.4 14.0 41 >38   Motor Summary Table   Stim Site NR Onset (ms) Norm Onset (ms) O-P Amp (mV) Norm O-P Amp Site1 Site2 Delta-0 (ms) Dist (cm) Vel (m/s) Norm Vel (m/s)  Left Median Motor (Abd Poll Brev)  31C  Wrist    3.2 <4.2 8.2 >5 Elbow Wrist 4.1 23.5 57 >50  Elbow    7.3  7.8         Left Ulnar Motor (Abd Dig Min)  31.2C  Wrist    3.1 <4.2 7.9 >3 B Elbow Wrist 4.2 22.5 54 >53  B Elbow    7.3  7.9  A Elbow B Elbow 1.8 10.0 56 >53  A Elbow    9.1  7.9          EMG   Side Muscle Nerve Root Ins Act Fibs Psw Amp Dur Poly Recrt Int Bruna Comment  Left Abd Poll Brev Median C8-T1 Nml  Nml Nml Nml Nml 0 Nml Nml   Left 1stDorInt Ulnar C8-T1 Nml Nml Nml Nml Nml 0 Nml Nml   Left PronatorTeres Median C6-7 Nml Nml Nml Nml Nml 0 Nml Nml   Left Biceps Musculocut C5-6 Nml Nml Nml Nml Nml 0 Nml Nml   Left Deltoid Axillary C5-6 Nml Nml Nml Nml Nml 0 Nml Nml     Nerve Conduction Studies Anti Sensory Left/Right Comparison   Stim Site L Lat (ms) R Lat (ms) L-R Lat (ms) L Amp (V) R Amp (V) L-R Amp (%) Site1 Site2 L Vel (m/s) R Vel (m/s) L-R Vel (m/s)  Median Acr Palm Anti Sensory (2nd Digit)  31.2C  Wrist 3.1   36.7   Wrist Palm     Palm 1.7   37.1         Radial Anti Sensory (Base 1st Digit)  30.8C  Wrist 2.2   29.8   Wrist Base 1st Digit     Ulnar Anti Sensory (5th Digit)  31.4C  Wrist 3.4   23.1   Wrist 5th Digit 41     Motor Left/Right Comparison  Stim Site L Lat (ms) R Lat (ms) L-R Lat (ms) L Amp (mV) R Amp (mV) L-R Amp (%) Site1 Site2 L Vel (m/s) R Vel (m/s) L-R Vel (m/s)  Median Motor (Abd Poll Brev)  31C  Wrist 3.2   8.2   Elbow Wrist 57    Elbow 7.3   7.8         Ulnar Motor (Abd Dig Min)  31.2C  Wrist 3.1   7.9   B Elbow Wrist 54    B Elbow 7.3   7.9   A Elbow B Elbow 56    A Elbow 9.1   7.9            Waveforms:

## 2024-06-19 NOTE — Progress Notes (Unsigned)
 Pain Scale   Average Pain 0 Patient advising he has numbness and tingling in his left hand, patient advising he is Right Hand Dominate        +Driver, -BT, -Dye Allergies.

## 2024-06-19 NOTE — Progress Notes (Unsigned)
   Jeffery Clements - 68 y.o. male MRN [id number]  Date of birth: 10/06/1955  Office Visit Note: Visit Date: 06/19/2024 PCP: Levora Reyes SAUNDERS, MD Referred by: Gretta Bertrum ORN, PA-C  Subjective: Chief Complaint  Patient presents with  . Left Hand - Numbness, Weakness   HPI: Jeffery Clements is a 68 y.o. male who comes in todayHPI   ROS Otherwise per HPI.  Assessment & Plan: Visit Diagnoses: No diagnosis found.   Plan: No additional findings.   Meds & Orders: No orders of the defined types were placed in this encounter.  No orders of the defined types were placed in this encounter.   Follow-up: No follow-ups on file.   Procedures: No procedures performed      Clinical History: No specialty comments available.   He reports that he has never smoked. He has never used smokeless tobacco.  Recent Labs    09/20/23 1039 02/12/24 0830 05/16/24 1025  HGBA1C 6.5 6.3 6.7*    Objective:  VS:  HT:    WT:   BMI:     BP:   HR: bpm  TEMP: ( )  RESP:  Physical Exam  Ortho Exam  Imaging: No results found.  Past Medical/Family/Surgical/Social History: Medications & Allergies reviewed per EMR, new medications updated. Patient Active Problem List   Diagnosis Date Noted  . Screening for malignant neoplasm of colon 09/20/2023  . Cough 08/21/2017  . Lower respiratory infection 08/21/2017  . Dyspnea on exertion 05/20/2016  . Sensorineural hearing loss (SNHL) 05/17/2016  . Subjective tinnitus of both ears 05/17/2016  . Hematuria 04/14/2015  . Hypertriglyceridemia 09/11/2012  . Rectal fissure 09/11/2012  . Colon polyps 09/11/2012   No past medical history on file. Family History  Problem Relation Age of Onset  . Lung disease Mother    Past Surgical History:  Procedure Laterality Date  . VASECTOMY     Social History   Occupational History  . Occupation: Agricultural Engineer: [employer]  Tobacco Use  . Smoking status: Never  . Smokeless tobacco: Never   Vaping Use  . Vaping status: Never Used  Substance and Sexual Activity  . Alcohol use: No  . Drug use: No  . Sexual activity: Not Currently

## 2024-06-20 ENCOUNTER — Encounter: Payer: Self-pay | Admitting: Physical Medicine and Rehabilitation

## 2024-06-24 ENCOUNTER — Encounter: Payer: Self-pay | Admitting: Radiology

## 2024-06-27 ENCOUNTER — Encounter: Payer: Self-pay | Admitting: Orthopaedic Surgery

## 2024-06-27 ENCOUNTER — Ambulatory Visit: Admitting: Orthopaedic Surgery

## 2024-06-27 ENCOUNTER — Other Ambulatory Visit: Payer: Self-pay

## 2024-06-27 DIAGNOSIS — R29898 Other symptoms and signs involving the musculoskeletal system: Secondary | ICD-10-CM | POA: Diagnosis not present

## 2024-06-27 DIAGNOSIS — M25512 Pain in left shoulder: Secondary | ICD-10-CM

## 2024-06-27 DIAGNOSIS — R202 Paresthesia of skin: Secondary | ICD-10-CM

## 2024-06-27 DIAGNOSIS — G8929 Other chronic pain: Secondary | ICD-10-CM

## 2024-06-27 NOTE — Progress Notes (Signed)
 The patient continues to have significant left shoulder pain in spite of conservative treatment including a comprehensive home exercise program with stretching that we have given him for his left shoulder.  He has had a subacromial steroid injection in that shoulder as well.  His x-rays were we reviewed and does show arthritic changes at the [facility] joint but the humeral head is not high riding and the glenohumeral joint looks good.  Recent nerve conduction studies of the left upper extremity due to numbness and tingling of his hand were normal.  I did discuss the results of those studies.  On my exam today he still shows significant signs of impingement and pain with the left shoulder.  With his right nonpainful dominant side he can reach up to the mid thoracic area.  With his left side reaching behind him he can only get to the bowel of the belt level just above his belt.  It is painful to do that as well.  There is weakness and decreased motion with external rotation.  At this point an MRI of his left shoulder is warranted to assess the rotator cuff and other structures around the shoulder so we can come up with a better treatment plan for him and plans for other interventions if warranted.  We will see him back in follow-up once we have that study.

## 2024-06-28 ENCOUNTER — Other Ambulatory Visit (HOSPITAL_COMMUNITY): Payer: Self-pay

## 2024-06-28 MED ORDER — TRIAMCINOLONE ACETONIDE 0.1 % EX CREA
1.0000 | TOPICAL_CREAM | Freq: Two times a day (BID) | CUTANEOUS | 2 refills | Status: AC | PRN
  Filled 2024-06-28: qty 454, 30d supply, fill #0

## 2024-07-02 ENCOUNTER — Encounter: Payer: Self-pay | Admitting: Orthopaedic Surgery

## 2024-07-09 ENCOUNTER — Other Ambulatory Visit

## 2024-09-10 ENCOUNTER — Other Ambulatory Visit

## 2024-10-21 ENCOUNTER — Encounter: Admitting: Family Medicine
